# Patient Record
Sex: Male | Born: 1990 | Race: White | Hispanic: No | Marital: Married | Smoking: Never smoker
Health system: Southern US, Community
[De-identification: ages and names within clinical notes are randomized; demographics above are authoritative.]

## PROBLEM LIST (undated history)

## (undated) DIAGNOSIS — F329 Major depressive disorder, single episode, unspecified: Secondary | ICD-10-CM

## (undated) DIAGNOSIS — F32A Depression, unspecified: Secondary | ICD-10-CM

## (undated) DIAGNOSIS — K219 Gastro-esophageal reflux disease without esophagitis: Secondary | ICD-10-CM

## (undated) DIAGNOSIS — Z9109 Other allergy status, other than to drugs and biological substances: Secondary | ICD-10-CM

## (undated) DIAGNOSIS — F419 Anxiety disorder, unspecified: Secondary | ICD-10-CM

## (undated) DIAGNOSIS — I493 Ventricular premature depolarization: Secondary | ICD-10-CM

## (undated) DIAGNOSIS — L409 Psoriasis, unspecified: Secondary | ICD-10-CM

## (undated) HISTORY — DX: Other allergy status, other than to drugs and biological substances: Z91.09

## (undated) HISTORY — DX: Major depressive disorder, single episode, unspecified: F32.9

## (undated) HISTORY — DX: Gastro-esophageal reflux disease without esophagitis: K21.9

## (undated) HISTORY — DX: Depression, unspecified: F32.A

## (undated) HISTORY — PX: WISDOM TOOTH EXTRACTION: SHX21

## (undated) HISTORY — DX: Anxiety disorder, unspecified: F41.9

## (undated) HISTORY — DX: Psoriasis, unspecified: L40.9

---
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
## 2010-08-10 HISTORY — PX: SHOULDER ARTHROSCOPY WITH LABRAL REPAIR: SHX5691

## 2016-01-02 ENCOUNTER — Other Ambulatory Visit: Payer: Self-pay | Admitting: Internal Medicine

## 2016-01-02 ENCOUNTER — Telehealth: Payer: Self-pay

## 2016-01-02 MED ORDER — AMOXICILLIN-POT CLAVULANATE 875-125 MG PO TABS: 1.0000 | ORAL_TABLET | Freq: Two times a day (BID) | ORAL | Status: DC

## 2016-01-02 MED ORDER — LEVOFLOXACIN 500 MG PO TABS: 500.0000 mg | ORAL_TABLET | Freq: Every day | ORAL | Status: DC

## 2016-01-02 NOTE — Telephone Encounter (Signed)
Right side of neck feels like it has a ball in it, heavy lump that has a itchy feeling, took one dose of the medication.  He states he doesn't, believe he has any allergies, but has never taken the medication before.  Please advise, I told him to hold taking the med for now.

## 2016-01-02 NOTE — Addendum Note (Signed)
Addended by: Acey LavOMAN, TANYA M on: 01/02/2016 01:54 PM   Modules accepted: Orders

## 2016-01-02 NOTE — Telephone Encounter (Signed)
We can examine him if he wants to come in. This may be a lymph node. He denied any allergy to PCN, however if he is concerned about allergy, we can call in Levaquin 500mg  daily for 7 days.

## 2016-01-02 NOTE — Telephone Encounter (Signed)
Spoke with Mal AmabileBrock, he agreed to Levaquin sent to the pharmacy.  He will come by to get a coupon code, thanks

## 2016-01-08 ENCOUNTER — Encounter: Payer: Self-pay | Admitting: Family Medicine

## 2016-01-09 ENCOUNTER — Telehealth: Payer: Self-pay | Admitting: Family Medicine

## 2016-01-09 ENCOUNTER — Other Ambulatory Visit: Payer: Self-pay | Admitting: Family Medicine

## 2016-01-09 MED ORDER — ESCITALOPRAM OXALATE 20 MG PO TABS: 20.0000 mg | ORAL_TABLET | Freq: Every day | ORAL | Status: DC

## 2016-01-09 MED ORDER — PANTOPRAZOLE SODIUM 40 MG PO TBEC: 40.0000 mg | DELAYED_RELEASE_TABLET | Freq: Every day | ORAL | Status: DC

## 2016-01-09 NOTE — Telephone Encounter (Signed)
Patient called stating [facility] Pt was told to see if [facility] [place] [facility] [place] could be transferred to [facility]. If they will not transfer and verbal will be given to [facility].

## 2016-01-24 ENCOUNTER — Ambulatory Visit (INDEPENDENT_AMBULATORY_CARE_PROVIDER_SITE_OTHER): Payer: 59 | Admitting: Family Medicine

## 2016-01-24 VITALS — BP 124/92 | HR 75 | Temp 98.3°F | Ht 72.03 in | Wt 256.2 lb

## 2016-01-24 DIAGNOSIS — Z13220 Encounter for screening for lipoid disorders: Secondary | ICD-10-CM | POA: Diagnosis not present

## 2016-01-24 DIAGNOSIS — F419 Anxiety disorder, unspecified: Secondary | ICD-10-CM

## 2016-01-24 DIAGNOSIS — E669 Obesity, unspecified: Secondary | ICD-10-CM

## 2016-01-24 DIAGNOSIS — R002 Palpitations: Secondary | ICD-10-CM | POA: Diagnosis not present

## 2016-01-24 LAB — COMPREHENSIVE METABOLIC PANEL
ALK PHOS: 90 U/L (ref 40–115)
ALT: 12 U/L (ref 9–46)
AST: 21 U/L (ref 10–40)
Albumin: 4.5 g/dL (ref 3.6–5.1)
BILIRUBIN TOTAL: 1.2 mg/dL (ref 0.2–1.2)
BUN: 12 mg/dL (ref 7–25)
CALCIUM: 9.1 mg/dL (ref 8.6–10.3)
CO2: 26 mmol/L (ref 20–31)
Chloride: 103 mmol/L (ref 98–110)
Creat: 1.07 mg/dL (ref 0.60–1.35)
GLUCOSE: 95 mg/dL (ref 65–99)
Potassium: 4.4 mmol/L (ref 3.5–5.3)
SODIUM: 137 mmol/L (ref 135–146)
Total Protein: 7.1 g/dL (ref 6.1–8.1)

## 2016-01-24 LAB — CBC
HCT: 46.5 % (ref 38.5–50.0)
Hemoglobin: 15.5 g/dL (ref 13.2–17.1)
MCH: 29 pg (ref 27.0–33.0)
MCHC: 33.3 g/dL (ref 32.0–36.0)
MCV: 86.9 fL (ref 80.0–100.0)
MPV: 9.9 fL (ref 7.5–12.5)
PLATELETS: 202 10*3/uL (ref 140–400)
RBC: 5.35 MIL/uL (ref 4.20–5.80)
RDW: 13.1 % (ref 11.0–15.0)
WBC: 7.8 10*3/uL (ref 3.8–10.8)

## 2016-01-24 LAB — LIPID PANEL
CHOL/HDL RATIO: 4.1 ratio (ref <=5.0)
CHOLESTEROL: 177 mg/dL (ref 125–200)
HDL: 43 mg/dL (ref >=40)
LDL Cholesterol: 98 mg/dL (ref <130)
Triglycerides: 180 mg/dL — ABNORMAL HIGH (ref <150)
VLDL: 36 mg/dL — ABNORMAL HIGH (ref <30)

## 2016-01-24 LAB — TSH: TSH: 1.34 m[IU]/L (ref 0.40–4.50)

## 2016-01-24 MED ORDER — ALPRAZOLAM 0.5 MG PO TABS: 0.5000 mg | ORAL_TABLET | Freq: Three times a day (TID) | ORAL | Status: DC | PRN

## 2016-01-24 NOTE — Progress Notes (Signed)
Pre visit review using our clinic review tool, if applicable. No additional management support is needed unless otherwise documented below in the visit note. 

## 2016-01-25 LAB — HEMOGLOBIN A1C
Hgb A1c MFr Bld: 5.3 % (ref <5.7)
MEAN PLASMA GLUCOSE: 105 mg/dL

## 2016-01-27 ENCOUNTER — Encounter: Payer: Self-pay | Admitting: Family Medicine

## 2016-01-27 DIAGNOSIS — R002 Palpitations: Secondary | ICD-10-CM | POA: Insufficient documentation

## 2016-01-27 DIAGNOSIS — F419 Anxiety disorder, unspecified: Secondary | ICD-10-CM | POA: Insufficient documentation

## 2016-01-27 NOTE — Assessment & Plan Note (Signed)
New problem (to me). Uncontrolled. Adding PRN Xanax today.

## 2016-01-27 NOTE — Progress Notes (Signed)
Subjective:  Patient ID: RAD GRAMLING, male    DOB: [birth year]  Age: 25 y.o. MRN: [id number]  CC: Anxiety, Palpitations  HPI Mark Palmer is a 25 y.o. male presents to the clinic today with the above complaints.  Anxiety  Patient has a longstanding history of anxiety.  He reports recent worsening since leaving the [employer].  Anxiety is particularly troublesome at night.  He's had several episodeswhere he is incredibly anxious and experiences palpitations.  No known inciting factor. No exacerbating factors.  He is currently taking Lexapro.   He has been on Benzos in the past.  He reports improvement with Lexapro but still has significant symptoms.  Palpitations/heart fluttering  Particularly at night.  Likely associated with anxiety.  No reports of chest pain or shortness of breath.  He has had some associated dizziness.  No syncope.  Appears to be worsened by anxiety.  PMH, Surgical Hx, Family Hx, Social History reviewed and updated as below.  Past Medical History  Diagnosis Date  . Depression   . GERD (gastroesophageal reflux disease)   . Environmental allergies   . Anxiety   . Psoriasis    Past Surgical History  Procedure Laterality Date  . Shoulder arthroscopy with labral repair Right 2012  . Wisdom tooth extraction     Family History  Problem Relation Age of Onset  . Prostate cancer Maternal Grandfather   . Stroke Maternal Grandfather   . Hypertension Mother   . Hypertension Maternal Uncle   . Mental illness Mother     anxiety  . Mental illness Maternal Aunt     anxiety  . Mental illness Maternal Aunt     anxiety  . Mental illness Maternal Uncle     anxiety  . Diabetes Maternal Grandfather   . Diabetes Maternal Uncle    Social History  Substance Use Topics  . Smoking status: Never Smoker   . Smokeless tobacco: Current User    Types: Snuff  . Alcohol Use: 0.0 - 0.6 oz/week    0-1 Standard drinks or equivalent per week   Review of  Systems  Cardiovascular: Positive for palpitations.  Psychiatric/Behavioral: The patient is nervous/anxious.   All other systems reviewed and are negative.  Objective:   Today's Vitals: BP 124/92 mmHg  Pulse 75  Temp(Src) 98.3 F (36.8 C) (Oral)  Ht 6' 0.02" (1.829 m)  Wt 256 lb 4 oz (116.234 kg)  BMI 34.75 kg/m2  SpO2 98%  Physical Exam  Constitutional: He is oriented to person, place, and time. He appears well-developed and well-nourished. No distress.  HENT:  Head: Normocephalic and atraumatic.  Nose: Nose normal.  Mouth/Throat: Oropharynx is clear and moist. No oropharyngeal exudate.  Normal TM's bilaterally.   Eyes: Conjunctivae are normal. No scleral icterus.  Neck: Neck supple. No thyromegaly present.  Cardiovascular: Normal rate and regular rhythm.   No murmur heard. Pulmonary/Chest: Effort normal and breath sounds normal. He has no wheezes. He has no rales.  Abdominal: Soft. He exhibits no distension. There is no tenderness. There is no rebound and no guarding.  Musculoskeletal: Normal range of motion. He exhibits no edema.  Lymphadenopathy:    He has no cervical adenopathy.  Neurological: He is alert and oriented to person, place, and time.  Skin: Skin is warm and dry. No rash noted.  Psychiatric:  Anxious.  Vitals reviewed.  Assessment & Plan:   Problem List Items Addressed This Visit    Anxiety - Primary    New  problem (to me). Uncontrolled. Adding PRN Xanax today.      Relevant Medications   ALPRAZolam (XANAX) 0.5 MG tablet   Palpitations    Patient concerned about cardiac etiology. Long discussion about low likelihood for cardiac etiology.  Likely secondary to anxiety. Will continue to monitor.      Relevant Orders   CBC (Completed)   Comp Met (CMET) (Completed)   TSH (Completed)    Other Visit Diagnoses    Screening, lipid        Relevant Orders    Lipid Profile (Completed)    Obesity (BMI 30.0-34.9)        Relevant Orders    HgB A1c  (Completed)       Outpatient Encounter Prescriptions as of 01/24/2016  Medication Sig  . escitalopram (LEXAPRO) 20 MG tablet Take 1 tablet (20 mg total) by mouth daily.  . pantoprazole (PROTONIX) 40 MG tablet Take 1 tablet (40 mg total) by mouth daily.  Marland Kitchen ALPRAZolam (XANAX) 0.5 MG tablet Take 1 tablet (0.5 mg total) by mouth 3 (three) times daily as needed for anxiety.   No facility-administered encounter medications on file as of 01/24/2016.   Follow-up: PRN  [place]

## 2016-01-27 NOTE — Assessment & Plan Note (Signed)
Patient concerned about cardiac etiology. Long discussion about low likelihood for cardiac etiology.  Likely secondary to anxiety. Will continue to monitor.

## 2016-03-13 ENCOUNTER — Other Ambulatory Visit: Payer: Self-pay | Admitting: Internal Medicine

## 2016-03-13 ENCOUNTER — Telehealth: Payer: Self-pay | Admitting: Family Medicine

## 2016-03-13 ENCOUNTER — Other Ambulatory Visit: Payer: Self-pay | Admitting: Family Medicine

## 2016-03-13 ENCOUNTER — Ambulatory Visit: Payer: 59 | Admitting: Internal Medicine

## 2016-03-13 ENCOUNTER — Ambulatory Visit (INDEPENDENT_AMBULATORY_CARE_PROVIDER_SITE_OTHER): Payer: 59 | Admitting: Internal Medicine

## 2016-03-13 ENCOUNTER — Other Ambulatory Visit (INDEPENDENT_AMBULATORY_CARE_PROVIDER_SITE_OTHER): Payer: 59

## 2016-03-13 DIAGNOSIS — R079 Chest pain, unspecified: Secondary | ICD-10-CM

## 2016-03-13 DIAGNOSIS — R002 Palpitations: Secondary | ICD-10-CM

## 2016-03-13 LAB — COMPREHENSIVE METABOLIC PANEL
ALBUMIN: 4.6 g/dL (ref 3.5–5.2)
ALK PHOS: 88 U/L (ref 39–117)
ALT: 12 U/L (ref 0–53)
AST: 20 U/L (ref 0–37)
BILIRUBIN TOTAL: 1.1 mg/dL (ref 0.2–1.2)
BUN: 15 mg/dL (ref 6–23)
CALCIUM: 9.6 mg/dL (ref 8.4–10.5)
CO2: 28 mEq/L (ref 19–32)
Chloride: 102 mEq/L (ref 96–112)
Creatinine, Ser: 1.12 mg/dL (ref 0.40–1.50)
GFR: 84.37 mL/min (ref >60.00)
GLUCOSE: 110 mg/dL — AB (ref 70–99)
Potassium: 4.5 mEq/L (ref 3.5–5.1)
Sodium: 138 mEq/L (ref 135–145)
TOTAL PROTEIN: 7.2 g/dL (ref 6.0–8.3)

## 2016-03-13 LAB — TROPONIN I: TNIDX: 0 ug/l (ref 0.00–0.06)

## 2016-03-13 LAB — CBC
HCT: 43.6 % (ref 39.0–52.0)
Hemoglobin: 15.3 g/dL (ref 13.0–17.0)
MCHC: 35.2 g/dL (ref 30.0–36.0)
MCV: 83.6 fl (ref 78.0–100.0)
PLATELETS: 233 10*3/uL (ref 150.0–400.0)
RBC: 5.22 Mil/uL (ref 4.22–5.81)
RDW: 12.3 % (ref 11.5–15.5)
WBC: 5.7 10*3/uL (ref 4.0–10.5)

## 2016-03-13 NOTE — Progress Notes (Signed)
Patient is stating he is having palpitations. He asked for a EKG. PCP agreed to have order placed.

## 2016-03-13 NOTE — Telephone Encounter (Signed)
Patient stated that he is having heart flutters. He is requesting a EKG to make sure that his heart is okay. Are you okay with this order being placed?

## 2016-03-13 NOTE — Telephone Encounter (Signed)
EKG okay to be ordered.

## 2016-03-16 ENCOUNTER — Other Ambulatory Visit: Payer: Self-pay | Admitting: Family Medicine

## 2016-03-16 DIAGNOSIS — R079 Chest pain, unspecified: Secondary | ICD-10-CM

## 2016-03-16 DIAGNOSIS — R002 Palpitations: Secondary | ICD-10-CM

## 2016-03-24 ENCOUNTER — Other Ambulatory Visit: Payer: Self-pay

## 2016-03-24 ENCOUNTER — Telehealth: Payer: Self-pay | Admitting: Family Medicine

## 2016-03-24 ENCOUNTER — Ambulatory Visit (INDEPENDENT_AMBULATORY_CARE_PROVIDER_SITE_OTHER): Payer: 59

## 2016-03-24 DIAGNOSIS — R079 Chest pain, unspecified: Secondary | ICD-10-CM

## 2016-03-24 DIAGNOSIS — R002 Palpitations: Secondary | ICD-10-CM

## 2016-03-24 NOTE — Telephone Encounter (Signed)
Patient wanted to know the results of echocardiogram. Please look over and let patient know results.

## 2016-03-24 NOTE — Telephone Encounter (Signed)
In progress. Not available to read.

## 2016-03-25 ENCOUNTER — Encounter: Payer: Self-pay | Admitting: Family Medicine

## 2016-03-27 ENCOUNTER — Ambulatory Visit
Admission: RE | Admit: 2016-03-27 | Discharge: 2016-03-27 | Disposition: A | Discharge status: Home / Self Care | Payer: 59 | Source: Ambulatory Visit | Attending: Cardiovascular Disease | Admitting: Cardiovascular Disease

## 2016-03-27 DIAGNOSIS — R002 Palpitations: Secondary | ICD-10-CM | POA: Insufficient documentation

## 2016-04-05 ENCOUNTER — Other Ambulatory Visit: Payer: Self-pay | Admitting: Family Medicine

## 2016-04-06 MED ORDER — PANTOPRAZOLE SODIUM 40 MG PO TBEC: 40.0000 mg | DELAYED_RELEASE_TABLET | Freq: Every day | ORAL | 3 refills | Status: DC

## 2016-04-06 MED ORDER — ESCITALOPRAM OXALATE 20 MG PO TABS: 20.0000 mg | ORAL_TABLET | Freq: Every day | ORAL | 3 refills | Status: DC

## 2016-06-09 ENCOUNTER — Encounter: Payer: Self-pay | Admitting: Family

## 2016-06-09 ENCOUNTER — Ambulatory Visit (INDEPENDENT_AMBULATORY_CARE_PROVIDER_SITE_OTHER): Payer: 59 | Admitting: Family

## 2016-06-09 VITALS — BP 128/88 | HR 89 | Temp 98.1°F | Ht 72.0 in | Wt 270.0 lb

## 2016-06-09 DIAGNOSIS — F419 Anxiety disorder, unspecified: Secondary | ICD-10-CM

## 2016-06-09 DIAGNOSIS — R002 Palpitations: Secondary | ICD-10-CM | POA: Diagnosis not present

## 2016-06-09 NOTE — Progress Notes (Signed)
Subjective:    Patient ID: Mark Palmer, male    DOB: 04-May-1991, 25 y.o.   MRN: [id number]  CC: Mark Palmer is a 25 y.o. male who presents today for an acute visit.    HPI: Patient here for acute visit with chief complaint of heart palpitations a couple of hours ago. Felt 'flutter an blood rush to head'. Feeling anxious and SOB 'in general'. No SOB with movement.  Has a history of anxiety.  he notes shortness of breath, chest tightness. Notes a HA yesterday on left side of eye, resolved with motrin.  Hasn't taken xanax today.   Denies exertional chest pain or pressure, numbness or tingling radiating to left arm or jaw,  dizziness,changes in vision.   Holter monitor and echo recently done and normal. Holter showed PVCs and PACs.     HISTORY:  Past Medical History:  Diagnosis Date  . Anxiety   . Depression   . Environmental allergies   . GERD (gastroesophageal reflux disease)   . Psoriasis    Past Surgical History:  Procedure Laterality Date  . SHOULDER ARTHROSCOPY WITH LABRAL REPAIR Right 2012  . WISDOM TOOTH EXTRACTION     Family History  Problem Relation Age of Onset  . Prostate cancer Maternal Grandfather   . Stroke Maternal Grandfather   . Diabetes Maternal Grandfather   . Hypertension Mother   . Mental illness Mother     anxiety  . Hypertension Maternal Uncle   . Mental illness Maternal Aunt     anxiety  . Mental illness Maternal Aunt     anxiety  . Mental illness Maternal Uncle     anxiety  . Diabetes Maternal Uncle     Allergies: Amoxicillin Current Outpatient Prescriptions on File Prior to Visit  Medication Sig Dispense Refill  . ALPRAZolam (XANAX) 0.5 MG tablet Take 1 tablet (0.5 mg total) by mouth 3 (three) times daily as needed for anxiety. 30 tablet 0  . escitalopram (LEXAPRO) 20 MG tablet Take 1 tablet (20 mg total) by mouth daily. 90 tablet 3  . pantoprazole (PROTONIX) 40 MG tablet Take 1 tablet (40 mg total) by mouth daily. 90 tablet 3   No  current facility-administered medications on file prior to visit.     Social History  Substance Use Topics  . Smoking status: Never Smoker  . Smokeless tobacco: Current User    Types: Snuff  . Alcohol use 0.0 - 0.6 oz/week    Review of Systems  Constitutional: Negative for chills and fever.  Eyes: Negative for visual disturbance.  Respiratory: Positive for chest tightness and shortness of breath. Negative for cough.   Cardiovascular: Positive for palpitations. Negative for chest pain.  Gastrointestinal: Negative for nausea and vomiting.  Neurological: Negative for dizziness, light-headedness and headaches (resolved).  Psychiatric/Behavioral: The patient is nervous/anxious.       Objective:    BP 128/88   Pulse 89   Temp 98.1 F (36.7 C) (Oral)   Ht 6' (1.829 m)   Wt 270 lb (122.5 kg)   SpO2 97%   BMI 36.62 kg/m    Physical Exam  Constitutional: He appears well-developed and well-nourished.  Cardiovascular: Regular rhythm and normal heart sounds.   Pulmonary/Chest: Effort normal and breath sounds normal. No respiratory distress. He has no wheezes. He has no rhonchi. He has no rales.  Neurological: He is alert.  Skin: Skin is warm and dry.  Psychiatric: He has a normal mood and affect. His speech is  normal and behavior is normal.  Vitals reviewed.      Assessment & Plan:   Problem List Items Addressed This Visit      Other   Anxiety    Suspect anxiety causing palpitations. Advised patient to take one xanax as prescribed to see if improves. Agreeable to counseling as well. Referral placed.       Relevant Orders   Ambulatory referral to Psychiatry   Palpitations - Primary    Reassured by normal EKG and recent cardiac work up. Normal sinus rhythm. 86 bpm. When compared to prior EKG 03/2016, no significant changes. No ischemia. SaO2 97.      Relevant Orders   EKG 12-Lead (Completed)    Other Visit Diagnoses   None.       I am having Mr. Mark Palmer maintain  his ALPRAZolam, escitalopram, and pantoprazole.   No orders of the defined types were placed in this encounter.   Return precautions given.   Risks, benefits, and alternatives of the medications and treatment plan prescribed today were discussed, and patient expressed understanding.   Education regarding symptom management and diagnosis given to patient on AVS.  Continue to follow with Tommie SamsJayce G Cook, DO for routine health maintenance.   Mark Palmer and I agreed with plan.   Rennie PlowmanMargaret Kaymen Adrian, FNP

## 2016-06-09 NOTE — Assessment & Plan Note (Addendum)
Reassured by normal EKG and recent cardiac work up. Normal sinus rhythm. 86 bpm. When compared to prior EKG 03/2016, no significant changes. No ischemia. SaO2 97.

## 2016-06-09 NOTE — Progress Notes (Signed)
Pre visit review using our clinic review tool, if applicable. No additional management support is needed unless otherwise documented below in the visit note. 

## 2016-06-09 NOTE — Patient Instructions (Signed)
Suspect anxiety is contributing.   Please take xanax and see if improves.  Please let me know if not better.

## 2016-06-09 NOTE — Assessment & Plan Note (Signed)
Suspect anxiety causing palpitations. Advised patient to take one xanax as prescribed to see if improves. Agreeable to counseling as well. Referral placed.

## 2016-07-06 ENCOUNTER — Other Ambulatory Visit: Payer: Self-pay | Admitting: Family Medicine

## 2016-07-06 MED ORDER — PANTOPRAZOLE SODIUM 40 MG PO TBEC: 40.0000 mg | DELAYED_RELEASE_TABLET | Freq: Every day | ORAL | 3 refills | Status: DC

## 2016-07-06 MED ORDER — ESCITALOPRAM OXALATE 20 MG PO TABS: 20.0000 mg | ORAL_TABLET | Freq: Every day | ORAL | 3 refills | Status: DC

## 2016-07-06 NOTE — Telephone Encounter (Signed)
Pt called requesting a refill on protonix and lexapro.

## 2016-07-15 ENCOUNTER — Other Ambulatory Visit: Payer: Self-pay | Admitting: Family Medicine

## 2016-07-15 MED ORDER — FLUOCINONIDE 0.05 % EX CREA: 1.0000 "application " | TOPICAL_CREAM | Freq: Two times a day (BID) | CUTANEOUS | 0 refills | Status: DC

## 2016-10-29 NOTE — Progress Notes (Signed)
Lab encounter

## 2016-11-10 ENCOUNTER — Emergency Department: Payer: 59

## 2016-11-10 ENCOUNTER — Encounter: Payer: Self-pay | Admitting: Emergency Medicine

## 2016-11-10 DIAGNOSIS — R0602 Shortness of breath: Secondary | ICD-10-CM | POA: Diagnosis not present

## 2016-11-10 DIAGNOSIS — R079 Chest pain, unspecified: Secondary | ICD-10-CM | POA: Diagnosis not present

## 2016-11-10 DIAGNOSIS — F17290 Nicotine dependence, other tobacco product, uncomplicated: Secondary | ICD-10-CM | POA: Diagnosis not present

## 2016-11-10 DIAGNOSIS — Z79899 Other long term (current) drug therapy: Secondary | ICD-10-CM | POA: Insufficient documentation

## 2016-11-10 DIAGNOSIS — R002 Palpitations: Secondary | ICD-10-CM | POA: Insufficient documentation

## 2016-11-10 LAB — BASIC METABOLIC PANEL
ANION GAP: 7 (ref 5–15)
BUN: 14 mg/dL (ref 6–20)
CALCIUM: 8.9 mg/dL (ref 8.9–10.3)
CO2: 27 mmol/L (ref 22–32)
Chloride: 104 mmol/L (ref 101–111)
Creatinine, Ser: 1.06 mg/dL (ref 0.61–1.24)
Glucose, Bld: 97 mg/dL (ref 65–99)
Potassium: 3.5 mmol/L (ref 3.5–5.1)
Sodium: 138 mmol/L (ref 135–145)

## 2016-11-10 LAB — CBC
HCT: 40.3 % (ref 40.0–52.0)
HEMOGLOBIN: 14.2 g/dL (ref 13.0–18.0)
MCH: 29.8 pg (ref 26.0–34.0)
MCHC: 35.3 g/dL (ref 32.0–36.0)
MCV: 84.6 fL (ref 80.0–100.0)
Platelets: 215 10*3/uL (ref 150–440)
RBC: 4.77 MIL/uL (ref 4.40–5.90)
RDW: 12.2 % (ref 11.5–14.5)
WBC: 7 10*3/uL (ref 3.8–10.6)

## 2016-11-10 LAB — TROPONIN I

## 2016-11-10 NOTE — ED Notes (Signed)
NP from [facility] called, pt called co palpitations she was concerned about electrolyte imbalance due to diarrhea, or anxiety.

## 2016-11-10 NOTE — ED Triage Notes (Signed)
Patient ambulatory to triage with steady gait, without difficulty or distress noted; pt reports feeling heart "flutters"; st hx of same and dx with PVC and PACs; pt reports discomfort to mid chest with no accomp symptoms

## 2016-11-11 ENCOUNTER — Emergency Department
Admission: EM | Admit: 2016-11-11 | Discharge: 2016-11-11 | Disposition: A | Discharge status: Home / Self Care | Payer: 59 | Attending: Emergency Medicine | Admitting: Emergency Medicine

## 2016-11-11 DIAGNOSIS — R002 Palpitations: Secondary | ICD-10-CM

## 2016-11-11 NOTE — ED Provider Notes (Signed)
[facility] Emergency Department Provider Note  ____________________________________________   First MD Initiated Contact with Patient 11/11/16 0200     (approximate)  I have reviewed the triage vital signs and the nursing notes.   HISTORY  Chief Complaint Palpitations    HPI Mark Palmer is a 26 y.o. male with a history of anxiety and palpitations who presents for evaluation of palpitations that started acutely earlier tonight and were severe.  Nothing particular made them worse and they got better after he took a Xanax.  He describes it as a feeling of fluttering in his chest with some associated chest discomfort and occasional shortness of breath.  It feels similar to what he has experienced in the past.  He goes to Dr. Adriana Simas who has worked him up previously with a Holter monitor and echocardiogram.  He was told that his symptoms may be the result of anxiety and he does note that after taking a Xanax tonight he did feel better.  He has not been ill recently with any cold like symptoms although he has had diarrhea for several days but that is improving.  He denies fever/chills, nausea, vomiting, constipation.   Past Medical History:  Diagnosis Date  . Anxiety   . Depression   . Environmental allergies   . GERD (gastroesophageal reflux disease)   . Psoriasis     Patient Active Problem List   Diagnosis Date Noted  . Anxiety 01/27/2016  . Palpitations 01/27/2016    Past Surgical History:  Procedure Laterality Date  . SHOULDER ARTHROSCOPY WITH LABRAL REPAIR Right 2012  . WISDOM TOOTH EXTRACTION      Prior to Admission medications   Medication Sig Start Date End Date Taking? Authorizing Provider  ALPRAZolam Prudy Feeler) 0.5 MG tablet Take 1 tablet (0.5 mg total) by mouth 3 (three) times daily as needed for anxiety. 01/24/16   Jayce G Cook, DO  escitalopram (LEXAPRO) 20 MG tablet Take 1 tablet (20 mg total) by mouth daily. 07/06/16   Tommie Sams, DO    fluocinonide cream (LIDEX) 0.05 % Apply 1 application topically 2 (two) times daily. 07/15/16   Tommie Sams, DO  pantoprazole (PROTONIX) 40 MG tablet Take 1 tablet (40 mg total) by mouth daily. 07/06/16   Tommie Sams, DO    Allergies Amoxicillin  Family History  Problem Relation Age of Onset  . Prostate cancer Maternal Grandfather   . Stroke Maternal Grandfather   . Diabetes Maternal Grandfather   . Hypertension Mother   . Mental illness Mother     anxiety  . Hypertension Maternal Uncle   . Mental illness Maternal Aunt     anxiety  . Mental illness Maternal Aunt     anxiety  . Mental illness Maternal Uncle     anxiety  . Diabetes Maternal Uncle     Social History Social History  Substance Use Topics  . Smoking status: Never Smoker  . Smokeless tobacco: Current User    Types: Snuff  . Alcohol use 0.0 - 0.6 oz/week    Review of Systems Constitutional: No fever/chills Eyes: No visual changes. ENT: No sore throat. Cardiovascular: Palpitations and associated chest pain Respiratory: Mild Shortness of breath associated with the palpitations Gastrointestinal: No abdominal pain.  No nausea, no vomiting.  No diarrhea.  No constipation. Genitourinary: Negative for dysuria. Musculoskeletal: Negative for back pain. Skin: Negative for rash. Neurological: Negative for headaches, focal weakness or numbness.  10-point ROS otherwise negative.  ____________________________________________  PHYSICAL EXAM:  VITAL SIGNS: ED Triage Vitals [11/10/16 2140]  Enc Vitals Group     BP (!) 150/95     Pulse Rate 87     Resp 18     Temp 99.1 F (37.3 C)     Temp Source Oral     SpO2 97 %     Weight 282 lb (127.9 kg)     Height 6' (1.829 m)     Head Circumference      Peak Flow      Pain Score      Pain Loc      Pain Edu?      Excl. in GC?     Constitutional: Alert and oriented. Well appearing and in no acute distress. Eyes: Conjunctivae are normal. PERRL. EOMI. Head:  Atraumatic. Nose: No congestion/rhinnorhea. Mouth/Throat: Mucous membranes are moist. Neck: No stridor.  No meningeal signs.   Cardiovascular: Normal rate, regular rhythm. Good peripheral circulation. Grossly normal heart sounds. Respiratory: Normal respiratory effort.  No retractions. Lungs CTAB. Gastrointestinal: Soft and nontender. No distention.  Musculoskeletal: No lower extremity tenderness nor edema. No gross deformities of extremities. Neurologic:  Normal speech and language. No gross focal neurologic deficits are appreciated.  Skin:  Skin is warm, dry and intact. No rash noted. Psychiatric: Mood and affect are normal. Speech and behavior are normal.  ____________________________________________   LABS (all labs ordered are listed, but only abnormal results are displayed)  Labs Reviewed  BASIC METABOLIC PANEL  CBC  TROPONIN I   ____________________________________________  EKG  ED ECG REPORT I, Emslee Lopezmartinez, the attending physician, personally viewed and interpreted this ECG.  Date: 11/10/2016 EKG Time: 21:38 Rate: 85 Rhythm: normal sinus rhythm QRS Axis: normal Intervals: normal ST/T Wave abnormalities: normal Conduction Disturbances: none Narrative Interpretation: unremarkable  ____________________________________________  RADIOLOGY   Dg Chest 2 View  Result Date: 11/10/2016 CLINICAL DATA:  Initial evaluation for acute chest pain. EXAM: CHEST  2 VIEW COMPARISON:  None. FINDINGS: The cardiac and mediastinal silhouettes are stable in size and contour, and remain within normal limits. The lungs are normally inflated. Mild patchy and linear densities within the lingula and right middle lobe, atelectasis is favored, although small infiltrate not excluded. No other focal airspace disease. No pulmonary edema or pleural effusion. No pneumothorax. No acute osseous abnormality identified. IMPRESSION: 1. Patchy and linear densities within the lingula and right middle  lobe. Atelectasis is favored, although superimposed infiltrate not entirely excluded, and could be considered in the correct clinical setting. 2. No other active cardiopulmonary disease. Electronically Signed   By: Rise Mu M.D.   On: 11/10/2016 22:26    ____________________________________________   PROCEDURES  Critical Care performed: No   Procedure(s) performed:   Procedures   ____________________________________________   INITIAL IMPRESSION / ASSESSMENT AND PLAN / ED COURSE  Pertinent labs & imaging results that were available during my care of the patient were reviewed by me and considered in my medical decision making (see chart for details).  The patient has been worked up extensively in the past with a Holter monitor and echocardiogram and the results were reassuring.  His symptoms improved today after taking his Xanax.  He is currently asymptomatic with a reassuring workup.  His chest x-ray brought up the possibility of atelectasis versus infiltrate but he has no respiratory difficulties, no fever, no leukocytosis, and otherwise normal vital signs.  I think pneumonia would be very unlikely and there is no indication for treatment based on a  questionable radiographic finding.  I had my usual customary palpitations discussion with the patient and he will follow-up with his primary care doctor who is managed his workup in the past.  There is no indication for repeat troponin.  I gave my usual and customary return precautions.         ____________________________________________  FINAL CLINICAL IMPRESSION(S) / ED DIAGNOSES  Final diagnoses:  Palpitations     MEDICATIONS GIVEN DURING THIS VISIT:  Medications - No data to display   NEW OUTPATIENT MEDICATIONS STARTED DURING THIS VISIT:  New Prescriptions   No medications on file    Modified Medications   No medications on file    Discontinued Medications   No medications on file     Note:   This document was prepared using Dragon voice recognition software and may include unintentional dictation errors.    Loleta Rose, MD 11/11/16 [phone]

## 2016-11-11 NOTE — Discharge Instructions (Signed)
As we discussed, your workup today was reassuring.  Though we do not know exactly what is causing your symptoms, it appears that you have no emergent medical condition at this time and that you are safe to go home and follow up as recommended in this paperwork. ° °Please return immediately to the Emergency Department if you develop any new or worsening symptoms that concern you. ° °

## 2016-11-11 NOTE — ED Notes (Signed)
Pt discharged to home.  Family member driving.  Discharge instructions reviewed.  Verbalized understanding.  No questions or concerns at this time.  Teach back verified.  Pt in NAD.  No items left in ED.   

## 2016-11-17 ENCOUNTER — Other Ambulatory Visit: Payer: Self-pay | Admitting: Family Medicine

## 2016-11-17 DIAGNOSIS — R002 Palpitations: Secondary | ICD-10-CM

## 2016-12-09 ENCOUNTER — Ambulatory Visit (INDEPENDENT_AMBULATORY_CARE_PROVIDER_SITE_OTHER): Payer: 59 | Admitting: Internal Medicine

## 2016-12-09 ENCOUNTER — Encounter: Payer: Self-pay | Admitting: Internal Medicine

## 2016-12-09 VITALS — BP 158/108 | HR 77 | Ht 72.0 in | Wt 289.0 lb

## 2016-12-09 DIAGNOSIS — R030 Elevated blood-pressure reading, without diagnosis of hypertension: Secondary | ICD-10-CM | POA: Diagnosis not present

## 2016-12-09 DIAGNOSIS — R002 Palpitations: Secondary | ICD-10-CM | POA: Diagnosis not present

## 2016-12-09 NOTE — Patient Instructions (Signed)
Medication Instructions:  Your physician recommends that you continue on your current medications as directed. Please refer to the Current Medication list given to you today.   Labwork: none  Testing/Procedures: none  Follow-Up: Your physician wants you to follow-up in: 3 MONTHS WITH DR END. You will receive a reminder letter in the mail two months in advance. If you don't receive a letter, please call our office to schedule the follow-up appointment.     DASH Eating Plan DASH stands for "Dietary Approaches to Stop Hypertension." The DASH eating plan is a healthy eating plan that has been shown to reduce high blood pressure (hypertension). It may also reduce your risk for type 2 diabetes, heart disease, and stroke. The DASH eating plan may also help with weight loss. What are tips for following this plan? General guidelines   Avoid eating more than 2,300 mg (milligrams) of salt (sodium) a day. If you have hypertension, you may need to reduce your sodium intake to 1,500 mg a day.  Limit alcohol intake to no more than 1 drink a day for nonpregnant women and 2 drinks a day for men. One drink equals 12 oz of beer, 5 oz of wine, or 1 oz of hard liquor.  Work with your health care provider to maintain a healthy body weight or to lose weight. Ask what an ideal weight is for you.  Get at least 30 minutes of exercise that causes your heart to beat faster (aerobic exercise) most days of the week. Activities may include walking, swimming, or biking.  Work with your health care provider or diet and nutrition specialist (dietitian) to adjust your eating plan to your individual calorie needs. Reading food labels   Check food labels for the amount of sodium per serving. Choose foods with less than 5 percent of the Daily Value of sodium. Generally, foods with less than 300 mg of sodium per serving fit into this eating plan.  To find whole grains, look for the word "whole" as the first word in the  ingredient list. Shopping   Buy products labeled as "low-sodium" or "no salt added."  Buy fresh foods. Avoid canned foods and premade or frozen meals. Cooking   Avoid adding salt when cooking. Use salt-free seasonings or herbs instead of table salt or sea salt. Check with your health care provider or pharmacist before using salt substitutes.  Do not fry foods. Cook foods using healthy methods such as baking, boiling, grilling, and broiling instead.  Cook with heart-healthy oils, such as olive, canola, soybean, or sunflower oil. Meal planning    Eat a balanced diet that includes:  5 or more servings of fruits and vegetables each day. At each meal, try to fill half of your plate with fruits and vegetables.  Up to 6-8 servings of whole grains each day.  Less than 6 oz of lean meat, poultry, or fish each day. A 3-oz serving of meat is about the same size as a deck of cards. One egg equals 1 oz.  2 servings of low-fat dairy each day.  A serving of nuts, seeds, or beans 5 times each week.  Heart-healthy fats. Healthy fats called Omega-3 fatty acids are found in foods such as flaxseeds and coldwater fish, like sardines, salmon, and mackerel.  Limit how much you eat of the following:  Canned or prepackaged foods.  Food that is high in trans fat, such as fried foods.  Food that is high in saturated fat, such as fatty meat.  Sweets, desserts, sugary drinks, and other foods with added sugar.  Full-fat dairy products.  Do not salt foods before eating.  Try to eat at least 2 vegetarian meals each week.  Eat more home-cooked food and less restaurant, buffet, and fast food.  When eating at a restaurant, ask that your food be prepared with less salt or no salt, if possible. What foods are recommended? The items listed may not be a complete list. Talk with your dietitian about what dietary choices are best for you. Grains  Whole-grain or whole-wheat bread. Whole-grain or  whole-wheat pasta. Brown rice. Orpah Cobb. Bulgur. Whole-grain and low-sodium cereals. Pita bread. Low-fat, low-sodium crackers. Whole-wheat flour tortillas. Vegetables  Fresh or frozen vegetables (raw, steamed, roasted, or grilled). Low-sodium or reduced-sodium tomato and vegetable juice. Low-sodium or reduced-sodium tomato sauce and tomato paste. Low-sodium or reduced-sodium canned vegetables. Fruits  All fresh, dried, or frozen fruit. Canned fruit in natural juice (without added sugar). Meat and other protein foods  Skinless chicken or [place]. Ground chicken or [place]. Pork with fat trimmed off. Fish and seafood. Egg whites. Dried beans, peas, or lentils. Unsalted nuts, nut butters, and seeds. Unsalted canned beans. Lean cuts of beef with fat trimmed off. Low-sodium, lean deli meat. Dairy  Low-fat (1%) or fat-free (skim) milk. Fat-free, low-fat, or reduced-fat cheeses. Nonfat, low-sodium ricotta or cottage cheese. Low-fat or nonfat yogurt. Low-fat, low-sodium cheese. Fats and oils  Soft margarine without trans fats. Vegetable oil. Low-fat, reduced-fat, or light mayonnaise and salad dressings (reduced-sodium). Canola, safflower, olive, soybean, and sunflower oils. Avocado. Seasoning and other foods  Herbs. Spices. Seasoning mixes without salt. Unsalted popcorn and pretzels. Fat-free sweets. What foods are not recommended? The items listed may not be a complete list. Talk with your dietitian about what dietary choices are best for you. Grains  Baked goods made with fat, such as croissants, muffins, or some breads. Dry pasta or rice meal packs. Vegetables  Creamed or fried vegetables. Vegetables in a cheese sauce. Regular canned vegetables (not low-sodium or reduced-sodium). Regular canned tomato sauce and paste (not low-sodium or reduced-sodium). Regular tomato and vegetable juice (not low-sodium or reduced-sodium). Rosita Fire. Olives. Fruits  Canned fruit in a light or heavy syrup. Fried  fruit. Fruit in cream or butter sauce. Meat and other protein foods  Fatty cuts of meat. Ribs. Fried meat. Tomasa Blase. Sausage. Bologna and other processed lunch meats. Salami. Fatback. Hotdogs. Bratwurst. Salted nuts and seeds. Canned beans with added salt. Canned or smoked fish. Whole eggs or egg yolks. Chicken or [place] with skin. Dairy  Whole or 2% milk, cream, and half-and-half. Whole or full-fat cream cheese. Whole-fat or sweetened yogurt. Full-fat cheese. Nondairy creamers. Whipped toppings. Processed cheese and cheese spreads. Fats and oils  Butter. Stick margarine. Lard. Shortening. Ghee. Bacon fat. Tropical oils, such as coconut, palm kernel, or palm oil. Seasoning and other foods  Salted popcorn and pretzels. Onion salt, garlic salt, seasoned salt, table salt, and sea salt. Worcestershire sauce. Tartar sauce. Barbecue sauce. Teriyaki sauce. Soy sauce, including reduced-sodium. Steak sauce. Canned and packaged gravies. Fish sauce. Oyster sauce. Cocktail sauce. Horseradish that you find on the shelf. Ketchup. Mustard. Meat flavorings and tenderizers. Bouillon cubes. Hot sauce and Tabasco sauce. Premade or packaged marinades. Premade or packaged taco seasonings. Relishes. Regular salad dressings. Where to find more information:  National Heart, Lung, and Blood Institute: PopSteam.is  American Heart Association: www.heart.org Summary  The DASH eating plan is a healthy eating plan that has been shown to reduce  high blood pressure (hypertension). It may also reduce your risk for type 2 diabetes, heart disease, and stroke.  With the DASH eating plan, you should limit salt (sodium) intake to 2,300 mg a day. If you have hypertension, you may need to reduce your sodium intake to 1,500 mg a day.  When on the DASH eating plan, aim to eat more fresh fruits and vegetables, whole grains, lean proteins, low-fat dairy, and heart-healthy fats.  Work with your health care provider or diet and  nutrition specialist (dietitian) to adjust your eating plan to your individual calorie needs. This information is not intended to replace advice given to you by your health care provider. Make sure you discuss any questions you have with your health care provider. Document Released: 07/16/2011 Document Revised: 07/20/2016 Document Reviewed: 07/20/2016 Elsevier Interactive Patient Education  2017 [employer].

## 2016-12-09 NOTE — Progress Notes (Signed)
New Outpatient Visit Date: 12/09/2016  Referring Provider: Tommie Sams, DO [address] [place], [place] [address]  Chief Complaint: Palpitations  HPI:  Mark Palmer is a 26 y.o. male who is being seen today for the evaluation of palpitations at the request of Dr. Adriana Simas. He has a history of anxiety, GERD, psoriasis, allergies. He reports intermittent palpitations that began last summer and were initially evaluated with a Holter monitor and echocardiogram. These had largely abated but returned in late March and early April. He described feeling skipped beats or brief flutters his chest that could occur as often as every few seconds. The episode in early April was preceded by nausea, vomiting, and diarrhea a few days earlier. His palpitations are associated with lightheadedness and the sensation of needing to catch his breath.  When he is active, he does not have any symptoms; palpitations are predominantly noticeable at rest. He presented to the ED on 11/11/16 for further evaluation due to increased frequency and duration of his palpitations. While sitting in the waiting room, he took a dose of Xanax and noticed significant improvement palpitations. Over the last 3 weeks, he has had only a few brief episodes. He consumes 3 cups of sweet tea per day but otherwise no caffeine.  Previous cardiac workup has included the aforementioned Holter monitor and echo. He also underwent a stress test many years ago at [facility] in [place] for evaluation of palpitations and abnormal EKG. Mark Palmer reports the stress test was normal and his symptoms were attributed to anxiety. Aside from the palpitations, he has not had any chest pain, shortness of breath, lightheadedness, orthopnea, PND, or edema. He works as a Engineer, site at [employer]. He feels as though some of his symptoms began after he moved [employer] following his [employer]. He has also gained about 15 pounds during this  time.  --------------------------------------------------------------------------------------------------  Cardiovascular History & Procedures: Cardiovascular Problems:  Palpitations  Risk Factors:  Obesity  Cath/PCI:  None  CV Surgery:  None  EP Procedures and Devices:  48-hour Holter monitor (03/24/16): Normal sinus rhythm with rare PVCs and PACs. No significant arrhythmias.  Non-Invasive Evaluation(s):  TTE (03/24/16): Normal LV size and function with LVEF 60-65% and normal wall motion. Normal RV size and function. No significant valvular abnormalities.  Recent CV Pertinent Labs: Lab Results  Component Value Date   CHOL 177 01/24/2016   HDL 43 01/24/2016   LDLCALC 98 01/24/2016   TRIG 180 (H) 01/24/2016   CHOLHDL 4.1 01/24/2016   K 3.5 11/10/2016   BUN 14 11/10/2016   CREATININE 1.06 11/10/2016   CREATININE 1.07 01/24/2016    --------------------------------------------------------------------------------------------------  Past Medical History:  Diagnosis Date  . Anxiety   . Depression   . Environmental allergies   . GERD (gastroesophageal reflux disease)   . Psoriasis     Past Surgical History:  Procedure Laterality Date  . SHOULDER ARTHROSCOPY WITH LABRAL REPAIR Right 2012  . WISDOM TOOTH EXTRACTION      Outpatient Encounter Prescriptions as of 12/09/2016  Medication Sig  . ALPRAZolam (XANAX) 0.5 MG tablet Take 1 tablet (0.5 mg total) by mouth 3 (three) times daily as needed for anxiety.  Marland Kitchen escitalopram (LEXAPRO) 20 MG tablet Take 1 tablet (20 mg total) by mouth daily.  . fluocinonide cream (LIDEX) 0.05 % Apply 1 application topically 2 (two) times daily.  . pantoprazole (PROTONIX) 40 MG tablet Take 1 tablet (40 mg total) by mouth daily.  No facility-administered encounter medications on file as of 12/09/2016.     Allergies: Amoxicillin  Social History   Social History  . Marital status: Single    Spouse name: N/A  . Number of children:  N/A  . Years of education: N/A   Occupational History  . Not on file.   Social History Main Topics  . Smoking status: Never Smoker  . Smokeless tobacco: Current User    Types: Snuff  . Alcohol use 0.0 - 0.6 oz/week     Comment: 1 drink every few months  . Drug use: No  . Sexual activity: Yes    Partners: Female   Other Topics Concern  . Not on file   Social History Narrative  . No narrative on file    Family History  Problem Relation Age of Onset  . Prostate cancer Maternal Grandfather   . Stroke Maternal Grandfather   . Diabetes Maternal Grandfather   . Heart attack Maternal Grandfather   . Hypertension Mother   . Mental illness Mother     anxiety  . Hyperlipidemia Mother   . Hypertension Maternal Uncle   . Heart disease Maternal Uncle   . Mental illness Maternal Aunt     anxiety  . Mental illness Maternal Aunt     anxiety  . Mental illness Maternal Uncle     anxiety  . Diabetes Maternal Uncle   . ADD / ADHD Sister   . Sudden Cardiac Death Neg Hx     Review of Systems: A 12-system review of systems was performed and was negative except as noted in the HPI.  --------------------------------------------------------------------------------------------------  Physical Exam: BP (!) 158/108 (BP Location: Left Arm, Patient Position: Sitting, Cuff Size: Normal)   Pulse 77   Ht 6' (1.829 m)   Wt 289 lb (131.1 kg)   BMI 39.20 kg/m   General:  Obese young man, seated comfortably in the exam room. HEENT: No conjunctival pallor or scleral icterus.  Moist mucous membranes.  OP clear. Neck: Supple without lymphadenopathy, thyromegaly, JVD, or HJR.  No carotid bruit. Lungs: Normal work of breathing.  Clear to auscultation bilaterally without wheezes or crackles. Heart: Regular rate and rhythm without murmurs, rubs, or gallops.  Unable to assess PMI due to body habitus. Abd: Bowel sounds present.  Soft, NT/ND without hepatosplenomegaly Ext: No lower extremity edema.   Radial, PT, and DP pulses are 2+ bilaterally Skin: warm and dry without rash Neuro: CNIII-XII intact.  Strength and fine-touch sensation intact in upper and lower extremities bilaterally. Psych: Normal mood and affect.  EKG:  Normal sinus rhythm without significant abnormalities.  Lab Results  Component Value Date   WBC 7.0 11/10/2016   HGB 14.2 11/10/2016   HCT 40.3 11/10/2016   MCV 84.6 11/10/2016   PLT 215 11/10/2016    Lab Results  Component Value Date   NA 138 11/10/2016   K 3.5 11/10/2016   CL 104 11/10/2016   CO2 27 11/10/2016   BUN 14 11/10/2016   CREATININE 1.06 11/10/2016   GLUCOSE 97 11/10/2016   ALT 12 03/13/2016    Lab Results  Component Value Date   CHOL 177 01/24/2016   HDL 43 01/24/2016   LDLCALC 98 01/24/2016   TRIG 180 (H) 01/24/2016   CHOLHDL 4.1 01/24/2016    --------------------------------------------------------------------------------------------------  ASSESSMENT AND PLAN: Palpitations Symptoms are sporadic and have improved with Xanax in the past. Echo and Holter monitor in 03/2016 were notable for a few PACs and PVCs as  well as mild sinus tachycardia. TSH was noted to be normal in 01/2016. Given that his symptoms have improved, we have agreed to defer additional testing and interventions. I have encouraged Mark Palmer to minimize his caffeine intake. He should continue following up with Dr. Adriana Simas for management of his anxiety.  Elevated blood pressure Blood pressure is moderately elevated today, though it has typically been upper normal to mildly elevated on previous checks in her system. We discussed the importance of lifestyle modification, including weight loss and salt restriction. If elevated blood pressure and palpitations remain a problem, we may need to pursue workup for secondary causes including pheochromocytoma.  Follow-up: Return to clinic in 3 months.  Yvonne Kendall, MD 12/10/2016 12:47 PM

## 2017-01-02 ENCOUNTER — Other Ambulatory Visit: Payer: Self-pay | Admitting: Family Medicine

## 2017-01-05 MED ORDER — ALPRAZOLAM 0.5 MG PO TABS: 0.5000 mg | ORAL_TABLET | Freq: Three times a day (TID) | ORAL | 0 refills | Status: DC | PRN

## 2017-01-05 NOTE — Telephone Encounter (Signed)
faxed

## 2017-01-05 NOTE — Telephone Encounter (Signed)
Last OV 01/24/16 last filled 01/24/16 30 0rf

## 2017-03-12 ENCOUNTER — Other Ambulatory Visit: Payer: Self-pay | Admitting: Family Medicine

## 2017-03-12 MED ORDER — CLOBETASOL PROPIONATE 0.05 % EX SHAM: MEDICATED_SHAMPOO | CUTANEOUS | 1 refills | Status: DC

## 2017-07-08 ENCOUNTER — Emergency Department: Payer: 59

## 2017-07-08 ENCOUNTER — Emergency Department
Admission: EM | Admit: 2017-07-08 | Discharge: 2017-07-09 | Disposition: A | Discharge status: Home / Self Care | Payer: 59 | Attending: Emergency Medicine | Admitting: Emergency Medicine

## 2017-07-08 DIAGNOSIS — F17220 Nicotine dependence, chewing tobacco, uncomplicated: Secondary | ICD-10-CM | POA: Diagnosis not present

## 2017-07-08 DIAGNOSIS — R002 Palpitations: Secondary | ICD-10-CM | POA: Insufficient documentation

## 2017-07-08 DIAGNOSIS — Z79899 Other long term (current) drug therapy: Secondary | ICD-10-CM | POA: Insufficient documentation

## 2017-07-08 DIAGNOSIS — I493 Ventricular premature depolarization: Secondary | ICD-10-CM | POA: Diagnosis not present

## 2017-07-08 DIAGNOSIS — R079 Chest pain, unspecified: Secondary | ICD-10-CM | POA: Diagnosis not present

## 2017-07-08 HISTORY — DX: Ventricular premature depolarization: I49.3

## 2017-07-08 LAB — BASIC METABOLIC PANEL
ANION GAP: 7 (ref 5–15)
BUN: 15 mg/dL (ref 6–20)
CALCIUM: 9.1 mg/dL (ref 8.9–10.3)
CO2: 24 mmol/L (ref 22–32)
CREATININE: 1.09 mg/dL (ref 0.61–1.24)
Chloride: 104 mmol/L (ref 101–111)
GFR calc Af Amer: >60 mL/min (ref >60)
GLUCOSE: 108 mg/dL — AB (ref 65–99)
Potassium: 3.9 mmol/L (ref 3.5–5.1)
Sodium: 135 mmol/L (ref 135–145)

## 2017-07-08 LAB — CBC
HCT: 43.2 % (ref 40.0–52.0)
Hemoglobin: 15 g/dL (ref 13.0–18.0)
MCH: 29.6 pg (ref 26.0–34.0)
MCHC: 34.8 g/dL (ref 32.0–36.0)
MCV: 85 fL (ref 80.0–100.0)
PLATELETS: 235 10*3/uL (ref 150–440)
RBC: 5.08 MIL/uL (ref 4.40–5.90)
RDW: 12.5 % (ref 11.5–14.5)
WBC: 9.2 10*3/uL (ref 3.8–10.6)

## 2017-07-08 LAB — TROPONIN I

## 2017-07-08 LAB — MAGNESIUM: MAGNESIUM: 2 mg/dL (ref 1.7–2.4)

## 2017-07-08 NOTE — ED Provider Notes (Signed)
[facility] Emergency Department Provider Note  ____________________________________________   First MD Initiated Contact with Patient 07/08/17 2259     (approximate)  I have reviewed the triage vital signs and the nursing notes.   HISTORY  Chief Complaint Chest Pain    HPI Mark Palmer is a 26 y.o. male with a history of PVCs who I saw approximately 7 months ago and who has been evaluated both by his PCP and by Dr. and with cardiology.  He presents tonight for further evaluation of what he describes as PVCs and palpitations leading to some chest tightness and occasional shortness of breath, or a "hitch" in his chest when the palpitations occur they cause him to gasp slightly.  He says that it leads to a pounding from his chest up into his head and a headache.  He has tried taking 1 mg of Xanax which sometimes helps because it is thought that stress compounds the issue, but the Xanax did not help tonight.  He feels like the PVCs are occurring about once every 4 beats.  He states that the symptoms got quite a bit better after getting to the emergency department.  Nothing in particular makes it better or worse.  He describes the symptoms as severe.  He reports that he has been going through a stressful time recently including closing on a house and less sleep than usual which may contribute.  He denies any increased use of caffeine recently.  Past Medical History:  Diagnosis Date  . Anxiety   . Depression   . Environmental allergies   . GERD (gastroesophageal reflux disease)   . Psoriasis   . PVC's (premature ventricular contractions)     Patient Active Problem List   Diagnosis Date Noted  . Anxiety 01/27/2016  . Palpitations 01/27/2016    Past Surgical History:  Procedure Laterality Date  . SHOULDER ARTHROSCOPY WITH LABRAL REPAIR Right 2012  . WISDOM TOOTH EXTRACTION      Prior to Admission medications   Medication Sig Start Date End Date Taking?  Authorizing Provider  ALPRAZolam Prudy Feeler) 0.5 MG tablet Take 1 tablet (0.5 mg total) by mouth 3 (three) times daily as needed for anxiety. 01/05/17   Tommie Sams, DO  Clobetasol Propionate 0.05 % shampoo Use daily. 03/12/17   Tommie Sams, DO  escitalopram (LEXAPRO) 20 MG tablet Take 1 tablet (20 mg total) by mouth daily. 07/06/16   Tommie Sams, DO  pantoprazole (PROTONIX) 40 MG tablet Take 1 tablet (40 mg total) by mouth daily. 07/06/16   Tommie Sams, DO    Allergies Amoxicillin  Family History  Problem Relation Age of Onset  . Prostate cancer Maternal Grandfather   . Stroke Maternal Grandfather   . Diabetes Maternal Grandfather   . Heart attack Maternal Grandfather   . Hypertension Mother   . Mental illness Mother        anxiety  . Hyperlipidemia Mother   . Hypertension Maternal Uncle   . Heart disease Maternal Uncle   . Mental illness Maternal Aunt        anxiety  . Mental illness Maternal Aunt        anxiety  . Mental illness Maternal Uncle        anxiety  . Diabetes Maternal Uncle   . ADD / ADHD Sister   . Sudden Cardiac Death Neg Hx     Social History Social History   Tobacco Use  . Smoking status: Never  Smoker  . Smokeless tobacco: Current User    Types: Snuff  Substance Use Topics  . Alcohol use: Yes    Alcohol/week: 0.0 - 0.6 oz    Comment: 1 drink every few months  . Drug use: No    Review of Systems Constitutional: No fever/chills Eyes: No visual changes. ENT: No sore throat. Cardiovascular: Frequent PVCs/palpitations leading to chest tightness Respiratory: Occasional shortness of breath as a result of PVCs and chest tightness Gastrointestinal: No abdominal pain.  No nausea, no vomiting.  No diarrhea.  No constipation. Genitourinary: Negative for dysuria. Musculoskeletal: Negative for neck pain.  Negative for back pain. Integumentary: Negative for rash. Neurological: Negative for headaches, focal weakness or  numbness.   ____________________________________________   PHYSICAL EXAM:  VITAL SIGNS: ED Triage Vitals  Enc Vitals Group     BP 07/08/17 2023 (!) 146/88     Pulse Rate 07/08/17 2023 97     Resp 07/08/17 2023 16     Temp 07/08/17 2023 99 F (37.2 C)     Temp Source 07/08/17 2023 Oral     SpO2 07/08/17 2023 98 %     Weight 07/08/17 2017 130.6 kg (288 lb)     Height --      Head Circumference --      Peak Flow --      Pain Score 07/08/17 2017 4     Pain Loc --      Pain Edu? --      Excl. in GC? --     Constitutional: Alert and oriented. Well appearing and in no acute distress. Eyes: Conjunctivae are normal.  Head: Atraumatic. Nose: No congestion/rhinnorhea. Mouth/Throat: Mucous membranes are moist. Neck: No stridor.  No meningeal signs.   Cardiovascular: Normal rate, regular rhythm. Good peripheral circulation. Grossly normal heart sounds.  Occasional PVC on the monitor Respiratory: Normal respiratory effort.  No retractions. Lungs CTAB. Gastrointestinal: Soft and nontender. No distention.  Musculoskeletal: No lower extremity tenderness nor edema. No gross deformities of extremities. Neurologic:  Normal speech and language. No gross focal neurologic deficits are appreciated.  Skin:  Skin is warm, dry and intact. No rash noted. Psychiatric: Mood and affect are normal. Speech and behavior are normal.  ____________________________________________   LABS (all labs ordered are listed, but only abnormal results are displayed)  Labs Reviewed  BASIC METABOLIC PANEL - Abnormal; Notable for the following components:      Result Value   Glucose, Bld 108 (*)    All other components within normal limits  CBC  TROPONIN I  MAGNESIUM   ____________________________________________  EKG  ED ECG REPORT I, Loleta Roseory Vashti Bolanos, the attending physician, personally viewed and interpreted this ECG.  Date: 07/08/2017 EKG Time: 20: 15 Rate: 97 Rhythm: normal sinus rhythm with  occasional PVC QRS Axis: normal Intervals: normal ST/T Wave abnormalities: normal Narrative Interpretation: no evidence of acute ischemia  ____________________________________________  RADIOLOGY   Dg Chest 2 View  Result Date: 07/08/2017 CLINICAL DATA:  Chest pain. EXAM: CHEST  2 VIEW COMPARISON:  Radiographs 11/10/2016 FINDINGS: The cardiomediastinal contours are normal. Subsegmental atelectasis or scarring in the lingula and right middle lobe. Pulmonary vasculature is normal. No consolidation, pleural effusion, or pneumothorax. No acute osseous abnormalities are seen. IMPRESSION: No active cardiopulmonary disease. Electronically Signed   By: Rubye OaksMelanie  Ehinger M.D.   On: 07/08/2017 20:59    ____________________________________________   PROCEDURES  Critical Care performed: No   Procedure(s) performed:   Procedures   ____________________________________________   INITIAL  IMPRESSION / ASSESSMENT AND PLAN / ED COURSE  As part of my medical decision making, I reviewed the following data within the electronic MEDICAL RECORD NUMBER Nursing notes reviewed and incorporated, EKG interpreted , Old chart reviewed and Notes from prior ED visits    Differential diagnosis includes, but is not limited to, ACS, aortic dissection, pulmonary embolism, cardiac tamponade, pneumothorax, pneumonia, pericarditis, myocarditis, GI-related causes including esophagitis/gastritis, and musculoskeletal chest wall pain.  However the patient's workup is reassuring.  His vital signs are within normal limits and his labs are all within normal limits as well.  I have added on a magnesium level but there is no indication of any electrolyte abnormality that is contributing although he felt them much more frequent earlier, they are now occurring much less frequency, with only one PVC showing up on his twelve-lead and only occasionally occurring on the monitor during our interview.  Had my usual and customary  PVC/palpitations discussion with him and explained that there is no evidence of any acute or emergent medical condition tonight.  We discussed how anxiety over the sensation of the PVCs is likely causing worsening of his symptoms and how it is reassuring that they got much better after he came to the emergency department.  We also discussed how I am not comfortable at this time writing a PRN beta-blocker for him and that even though he is in the emergency department his blood pressure is still quite appropriate, but he may benefit from seeing Dr. Okey DupreEnd in clinic again and having this discussion another time.  Apparently they discussed this in the past and since he still feels symptomatic he may benefit from further outpatient workup.  I [facility] a [facility] through [facility] to the cardiologist to bring this issue to his attention.  The she is comfortable with the plan to go home and follow-up as an outpatient once his magnesium level has been confirmed as normal.  I gave my usual and customary return precautions.  Clinical Course as of Jul 09 53  Thu Jul 08, 2017  2354 Normal mag, discharging as per plan described above Magnesium: 2.0 [CF]    Clinical Course User Index [CF] Loleta RoseForbach, Belmont Valli, MD    ____________________________________________  FINAL CLINICAL IMPRESSION(S) / ED DIAGNOSES  Final diagnoses:  Palpitations  PVC's (premature ventricular contractions)     MEDICATIONS GIVEN DURING THIS VISIT:  Medications - No data to display   ED Discharge Orders    None       Note:  This document was prepared using Dragon voice recognition software and may include unintentional dictation errors.    Loleta RoseForbach, Deandra Gadson, MD 07/09/17 [phone]

## 2017-07-08 NOTE — ED Triage Notes (Signed)
Patient states: "I'm having PVC's. It's going all the way up to my head". Patient reports hx of the same, however reports today's episode is worse.    Patient c/o central chest discomfort described as soreness and pressure. Patient reports concurrent symptoms of lightheadedness, SOB, weakness.

## 2017-07-08 NOTE — Discharge Instructions (Signed)
Your workup in the Emergency Department today was reassuring.  We did not find any specific abnormalities that are resulting in an increased frequency of your PVCs.  We recommend you drink plenty of fluids, take your regular medications and/or any new ones prescribed today, and follow up with the doctor(s) listed in these documents as recommended.  Return to the Emergency Department if you develop new or worsening symptoms that concern you.

## 2017-07-09 ENCOUNTER — Other Ambulatory Visit: Payer: Self-pay | Admitting: Internal Medicine

## 2017-07-09 ENCOUNTER — Telehealth: Payer: Self-pay | Admitting: Internal Medicine

## 2017-07-09 ENCOUNTER — Other Ambulatory Visit: Payer: Self-pay

## 2017-07-09 MED ORDER — ESCITALOPRAM OXALATE 20 MG PO TABS: 20.0000 mg | ORAL_TABLET | Freq: Every day | ORAL | 3 refills | Status: DC

## 2017-07-09 MED ORDER — PANTOPRAZOLE SODIUM 40 MG PO TBEC: 40.0000 mg | DELAYED_RELEASE_TABLET | Freq: Every day | ORAL | 3 refills | Status: DC

## 2017-07-09 MED ORDER — PROPRANOLOL HCL 10 MG PO TABS: 10.0000 mg | ORAL_TABLET | Freq: Three times a day (TID) | ORAL | 0 refills | Status: DC

## 2017-07-09 NOTE — Telephone Encounter (Signed)
Lmov for patient  They were seen in ED on 07/08/17 for CP  Pt has seen Dr End in past  Will need to try again

## 2017-07-09 NOTE — Telephone Encounter (Signed)
Please advise 

## 2017-07-09 NOTE — Telephone Encounter (Signed)
Pt scheduled. Nothing else needed.

## 2017-07-27 ENCOUNTER — Other Ambulatory Visit: Payer: Self-pay | Admitting: Internal Medicine

## 2017-07-27 MED ORDER — PROPRANOLOL HCL 10 MG PO TABS: 10.0000 mg | ORAL_TABLET | Freq: Three times a day (TID) | ORAL | 3 refills | Status: DC

## 2017-07-27 NOTE — Telephone Encounter (Signed)
Refilled: 07/09/2017 Last OV: 06/09/2016 Next OV: 08/16/2017

## 2017-08-09 ENCOUNTER — Ambulatory Visit: Payer: 59 | Admitting: Nurse Practitioner

## 2017-08-12 ENCOUNTER — Telehealth: Payer: Self-pay

## 2017-08-12 MED ORDER — FLUOCINONIDE 0.05 % EX CREA: 1.0000 "application " | TOPICAL_CREAM | Freq: Two times a day (BID) | CUTANEOUS | 2 refills | Status: DC

## 2017-08-12 NOTE — Telephone Encounter (Signed)
refilled 

## 2017-08-16 ENCOUNTER — Ambulatory Visit: Payer: 59 | Admitting: Internal Medicine

## 2017-08-19 ENCOUNTER — Telehealth: Payer: Self-pay

## 2017-08-19 NOTE — Telephone Encounter (Signed)
Pt has been scheduled.  °

## 2017-08-19 NOTE — Telephone Encounter (Signed)
Can you please schedule pt for 08/25/2017 @ 11:30am with Dr. Darrick Huntsmanullo for a transfer of care.

## 2017-08-25 ENCOUNTER — Ambulatory Visit: Payer: 59 | Admitting: Internal Medicine

## 2017-08-25 ENCOUNTER — Ambulatory Visit (INDEPENDENT_AMBULATORY_CARE_PROVIDER_SITE_OTHER): Payer: 59

## 2017-08-25 ENCOUNTER — Encounter: Payer: Self-pay | Admitting: Internal Medicine

## 2017-08-25 VITALS — BP 122/70 | HR 81 | Temp 98.2°F | Resp 16 | Ht 72.0 in | Wt 310.0 lb

## 2017-08-25 DIAGNOSIS — Z79899 Other long term (current) drug therapy: Secondary | ICD-10-CM | POA: Diagnosis not present

## 2017-08-25 DIAGNOSIS — L409 Psoriasis, unspecified: Secondary | ICD-10-CM | POA: Diagnosis not present

## 2017-08-25 DIAGNOSIS — E669 Obesity, unspecified: Secondary | ICD-10-CM

## 2017-08-25 DIAGNOSIS — F419 Anxiety disorder, unspecified: Secondary | ICD-10-CM | POA: Diagnosis not present

## 2017-08-25 DIAGNOSIS — R002 Palpitations: Secondary | ICD-10-CM

## 2017-08-25 DIAGNOSIS — M79672 Pain in left foot: Secondary | ICD-10-CM

## 2017-08-25 NOTE — Progress Notes (Signed)
Subjective:  Patient ID: Mark Palmer, male    DOB: 1990-09-29  Age: 27 y.o. MRN: [id number]  CC: The primary encounter diagnosis was Pain of left heel. Diagnoses of Obesity (BMI 30.0-34.9), Long-term use of high-risk medication, Palpitations, Obesity, morbid, BMI 40.0-49.9 (HCC), Psoriasis, and Anxiety were also pertinent to this visit.  HPI Mark Palmer presents for follow up on ER visit Nov 29  for tachycardia .  Labs  And EKG reviewed,  All were normal  including troponin were normal. He has been taking inderal since Dec 18th and has had no more episodes of tachycardia   OV with cardiology was cancelled for Dec 31 by patient since inderal is working. He has had a prior evaluation in 2017 with holter monitor which showed PAC's and PVC's   Diet reviewed:  He has one caffeinated beverage at lunch (sweet tea) .  fast food  On average once or twice daily   He has psoriasis managed with fluicinonide cream 2 times daily   Left foot pain.  Occurs after periods of rest  Describes it as stabbing pain in the heel  That occurs with weight bearing. No history of trauma, but served for 8 years in the  [employer]ed [employer] .   GERD:  Chronic,  Takes PPI daily   No prior b12 or Vi d check    Outpatient Medications Prior to Visit  Medication Sig Dispense Refill  . ALPRAZolam (XANAX) 0.5 MG tablet Take 1 tablet (0.5 mg total) by mouth 3 (three) times daily as needed for anxiety. 30 tablet 0  . Clobetasol Propionate 0.05 % shampoo Use daily. 118 mL 1  . escitalopram (LEXAPRO) 20 MG tablet Take 1 tablet (20 mg total) by mouth daily. 90 tablet 3  . fluocinonide cream (LIDEX) 0.05 % Apply 1 application topically 2 (two) times daily. 30 g 2  . pantoprazole (PROTONIX) 40 MG tablet Take 1 tablet (40 mg total) by mouth daily. 90 tablet 3  . propranolol (INDERAL) 10 MG tablet Take 1 tablet (10 mg total) by mouth 3 (three) times daily. As needed for rapid heart rate 90 tablet 3   No facility-administered  medications prior to visit.     Review of Systems;  Patient denies headache, fevers, malaise, unintentional weight loss, skin rash, eye pain, sinus congestion and sinus pain, sore throat, dysphagia,  hemoptysis , cough, dyspnea, wheezing, chest pain, palpitations, orthopnea, edema, abdominal pain, nausea, melena, diarrhea, constipation, flank pain, dysuria, hematuria, urinary  Frequency, nocturia, numbness, tingling, seizures,  Focal weakness, Loss of consciousness,  Tremor, insomnia, depression, anxiety, and suicidal ideation.      Objective:  BP 122/70 (BP Location: Left Arm, Patient Position: Sitting, Cuff Size: Large)   Pulse 81   Temp 98.2 F (36.8 C) (Oral)   Resp 16   Ht 6' (1.829 m)   Wt (!) 310 lb (140.6 kg)   SpO2 97%   BMI 42.04 kg/m   BP Readings from Last 3 Encounters:  08/25/17 122/70  07/09/17 120/83  12/09/16 (!) 158/108    Wt Readings from Last 3 Encounters:  08/25/17 (!) 310 lb (140.6 kg)  07/08/17 288 lb (130.6 kg)  12/09/16 289 lb (131.1 kg)    General appearance: alert, cooperative and appears stated age Ears: normal TM's and external ear canals both ears Throat: lips, mucosa, and tongue normal; teeth and gums normal Neck: no adenopathy, no carotid bruit, supple, symmetrical, trachea midline and thyroid not enlarged, symmetric, no tenderness/mass/nodules Back: symmetric,  no curvature. ROM normal. No CVA tenderness. Lungs: clear to auscultation bilaterally Heart: regular rate and rhythm, S1, S2 normal, no murmur, click, rub or gallop Abdomen: soft, non-tender; bowel sounds normal; no masses,  no organomegaly Pulses: 2+ and symmetric Skin: Skin color, texture, turgor normal. No rashes or lesions Lymph nodes: Cervical, supraclavicular, and axillary nodes normal.  Lab Results  Component Value Date   HGBA1C 5.3 01/24/2016    Lab Results  Component Value Date   CREATININE 1.09 07/08/2017   CREATININE 1.06 11/10/2016   CREATININE 1.12 03/13/2016     Lab Results  Component Value Date   WBC 9.2 07/08/2017   HGB 15.0 07/08/2017   HCT 43.2 07/08/2017   PLT 235 07/08/2017   GLUCOSE 108 (H) 07/08/2017   CHOL 177 01/24/2016   TRIG 180 (H) 01/24/2016   HDL 43 01/24/2016   LDLCALC 98 01/24/2016   ALT 12 03/13/2016   AST 20 03/13/2016   NA 135 07/08/2017   K 3.9 07/08/2017   CL 104 07/08/2017   CREATININE 1.09 07/08/2017   BUN 15 07/08/2017   CO2 24 07/08/2017   TSH 1.34 01/24/2016   HGBA1C 5.3 01/24/2016    Dg Chest 2 View  Result Date: 07/08/2017 CLINICAL DATA:  Chest pain. EXAM: CHEST  2 VIEW COMPARISON:  Radiographs 11/10/2016 FINDINGS: The cardiomediastinal contours are normal. Subsegmental atelectasis or scarring in the lingula and right middle lobe. Pulmonary vasculature is normal. No consolidation, pleural effusion, or pneumothorax. No acute osseous abnormalities are seen. IMPRESSION: No active cardiopulmonary disease. Electronically Signed   By: Rubye Oaks M.D.   On: 07/08/2017 20:59    Assessment & Plan:   Problem List Items Addressed This Visit    Anxiety    Managed with lexapro for years,  Has not used #30 prn alprazolam  In 7 months.       Obesity, morbid, BMI 40.0-49.9 (HCC)    I have addressed  BMI and recommended a low glycemic index diet utilizing smaller more frequent meals to increase metabolism.  I have also recommended that patient start exercising with a goal of 30 minutes of aerobic exercise a minimum of 5 days per week. Screening for lipid disorders, thyroid and diabetes to be done        Palpitations    Managed with low dose inderal.  Advised to reduce caffeine,  Start exercising,  Need to consider sleep apnea as a potential contributor given his BMI of 40      Psoriasis    Psoriasis involves scalp and is improving with resuming application of fluicinonide cream .  Advised to use it bid.        Other Visit Diagnoses    Pain of left heel    -  Primary   Relevant Orders   DG Foot  Complete Left (Completed)   Obesity (BMI 30.0-34.9)       Relevant Orders   Hemoglobin A1c   Lipid panel   Long-term use of high-risk medication       Relevant Orders   Vitamin B12   VITAMIN D 25 Hydroxy (Vit-D Deficiency, Fractures)     A total of 25 minutes of face to face time was spent with patient more than half of which was spent in counselling about the above mentioned conditions  and coordination of care  I am having Calix T. Biehl maintain his ALPRAZolam, Clobetasol Propionate, escitalopram, pantoprazole, propranolol, and fluocinonide cream.  No orders of the defined types were placed  in this encounter.   There are no discontinued medications.  Follow-up: No Follow-up on file.   Sherlene Shamseresa L Shawndale Kilpatrick, MD

## 2017-08-25 NOTE — Patient Instructions (Addendum)
Heel x ray ordered. I suspect a bone spur is the cause of your pain,  But arthritis and plantar fasciitis are also potential   You can try using Turmeric 500 mg twice daily ,  Or meloxicam 15 mg once daily   continue inderal twice daily for the palpitations  Return for fasting labs including a1c

## 2017-08-26 ENCOUNTER — Encounter: Payer: Self-pay | Admitting: Internal Medicine

## 2017-08-26 DIAGNOSIS — L409 Psoriasis, unspecified: Secondary | ICD-10-CM | POA: Insufficient documentation

## 2017-08-26 NOTE — Assessment & Plan Note (Signed)
Managed with low dose inderal.  Advised to reduce caffeine,  Start exercising,  Need to consider sleep apnea as a potential contributor given his BMI of 40

## 2017-08-26 NOTE — Assessment & Plan Note (Signed)
Managed with lexapro for years,  Has not used #30 prn alprazolam  In 7 months.

## 2017-08-26 NOTE — Assessment & Plan Note (Signed)
Psoriasis involves scalp and is improving with resuming application of fluicinonide cream .  Advised to use it bid.

## 2017-08-26 NOTE — Assessment & Plan Note (Signed)
I have addressed  BMI and recommended a low glycemic index diet utilizing smaller more frequent meals to increase metabolism.  I have also recommended that patient start exercising with a goal of 30 minutes of aerobic exercise a minimum of 5 days per week. Screening for lipid disorders, thyroid and diabetes to be done   

## 2017-09-15 DIAGNOSIS — H5213 Myopia, bilateral: Secondary | ICD-10-CM | POA: Diagnosis not present

## 2017-09-27 ENCOUNTER — Other Ambulatory Visit (INDEPENDENT_AMBULATORY_CARE_PROVIDER_SITE_OTHER): Payer: 59

## 2017-09-27 DIAGNOSIS — E669 Obesity, unspecified: Secondary | ICD-10-CM | POA: Diagnosis not present

## 2017-09-27 DIAGNOSIS — Z79899 Other long term (current) drug therapy: Secondary | ICD-10-CM | POA: Diagnosis not present

## 2017-09-27 LAB — LIPID PANEL
CHOLESTEROL: 232 mg/dL — AB (ref 0–200)
HDL: 30.4 mg/dL — ABNORMAL LOW (ref >39.00)
Total CHOL/HDL Ratio: 8

## 2017-09-27 LAB — LDL CHOLESTEROL, DIRECT: Direct LDL: 97 mg/dL

## 2017-09-27 LAB — HEMOGLOBIN A1C: Hgb A1c MFr Bld: 5.5 % (ref 4.6–6.5)

## 2017-09-27 LAB — VITAMIN B12: Vitamin B-12: 279 pg/mL (ref 211–911)

## 2017-09-27 LAB — VITAMIN D 25 HYDROXY (VIT D DEFICIENCY, FRACTURES): VITD: 13.66 ng/mL — AB (ref 30.00–100.00)

## 2017-09-29 ENCOUNTER — Encounter: Payer: Self-pay | Admitting: Internal Medicine

## 2017-09-29 ENCOUNTER — Other Ambulatory Visit: Payer: Self-pay | Admitting: Internal Medicine

## 2017-09-29 DIAGNOSIS — E782 Mixed hyperlipidemia: Secondary | ICD-10-CM

## 2017-09-29 DIAGNOSIS — E538 Deficiency of other specified B group vitamins: Secondary | ICD-10-CM

## 2017-09-29 MED ORDER — ERGOCALCIFEROL 1.25 MG (50000 UT) PO CAPS: 50000.0000 [IU] | ORAL_CAPSULE | ORAL | 0 refills | Status: DC

## 2017-09-29 MED ORDER — CYANOCOBALAMIN 1000 MCG/ML IJ SOLN: INTRAMUSCULAR | 1 refills | Status: DC

## 2017-10-15 ENCOUNTER — Other Ambulatory Visit: Payer: Self-pay | Admitting: Internal Medicine

## 2017-10-15 DIAGNOSIS — R0789 Other chest pain: Secondary | ICD-10-CM

## 2017-10-21 ENCOUNTER — Ambulatory Visit: Payer: 59

## 2017-10-29 ENCOUNTER — Ambulatory Visit
Admission: RE | Admit: 2017-10-29 | Discharge: 2017-10-29 | Disposition: A | Discharge status: Home / Self Care | Payer: 59 | Source: Ambulatory Visit | Attending: Internal Medicine | Admitting: Internal Medicine

## 2017-10-29 DIAGNOSIS — R0789 Other chest pain: Secondary | ICD-10-CM | POA: Insufficient documentation

## 2017-10-29 DIAGNOSIS — K760 Fatty (change of) liver, not elsewhere classified: Secondary | ICD-10-CM | POA: Diagnosis not present

## 2017-10-29 DIAGNOSIS — M25512 Pain in left shoulder: Secondary | ICD-10-CM | POA: Diagnosis not present

## 2017-11-22 ENCOUNTER — Other Ambulatory Visit: Payer: Self-pay

## 2017-11-22 MED ORDER — FLUOCINONIDE 0.05 % EX CREA: 1.0000 "application " | TOPICAL_CREAM | Freq: Two times a day (BID) | CUTANEOUS | 2 refills | Status: DC

## 2017-12-13 ENCOUNTER — Other Ambulatory Visit: Payer: Self-pay | Admitting: Internal Medicine

## 2017-12-13 DIAGNOSIS — K047 Periapical abscess without sinus: Secondary | ICD-10-CM

## 2017-12-13 MED ORDER — CLINDAMYCIN HCL 300 MG PO CAPS: ORAL_CAPSULE | ORAL | 0 refills | Status: DC

## 2017-12-13 MED ORDER — CLINDAMYCIN HCL 150 MG PO CAPS: ORAL_CAPSULE | ORAL | 0 refills | Status: DC

## 2017-12-13 NOTE — Progress Notes (Signed)
Assessed pt with cervical lymphadenopathy x 1 week  He does dip tobacco  Assessed teeth and had filling out in left upper molar and periodontal disease  Will try trial of clindamycin 450 mg tid x 1 week  He had sch appt with dentist  Prn Tylenol and Ibuprofen for reactive lymphadenopathy  If this does not resolved will need to see ENT   TMS

## 2018-02-18 ENCOUNTER — Encounter: Payer: Self-pay | Admitting: Internal Medicine

## 2018-02-21 ENCOUNTER — Other Ambulatory Visit: Payer: Self-pay | Admitting: Internal Medicine

## 2018-02-21 DIAGNOSIS — I493 Ventricular premature depolarization: Secondary | ICD-10-CM

## 2018-02-21 DIAGNOSIS — R002 Palpitations: Secondary | ICD-10-CM

## 2018-02-22 ENCOUNTER — Other Ambulatory Visit: Payer: Self-pay | Admitting: Internal Medicine

## 2018-02-22 ENCOUNTER — Ambulatory Visit: Payer: 59 | Admitting: Internal Medicine

## 2018-02-22 ENCOUNTER — Encounter: Payer: Self-pay | Admitting: Internal Medicine

## 2018-02-22 VITALS — BP 126/84 | HR 94 | Temp 98.1°F | Resp 15 | Ht 72.0 in | Wt 326.0 lb

## 2018-02-22 DIAGNOSIS — R7301 Impaired fasting glucose: Secondary | ICD-10-CM

## 2018-02-22 DIAGNOSIS — Z6835 Body mass index (BMI) 35.0-35.9, adult: Secondary | ICD-10-CM

## 2018-02-22 DIAGNOSIS — E538 Deficiency of other specified B group vitamins: Secondary | ICD-10-CM

## 2018-02-22 DIAGNOSIS — R002 Palpitations: Secondary | ICD-10-CM

## 2018-02-22 DIAGNOSIS — E781 Pure hyperglyceridemia: Secondary | ICD-10-CM

## 2018-02-22 DIAGNOSIS — R0683 Snoring: Secondary | ICD-10-CM | POA: Diagnosis not present

## 2018-02-22 DIAGNOSIS — E782 Mixed hyperlipidemia: Secondary | ICD-10-CM | POA: Diagnosis not present

## 2018-02-22 MED ORDER — METOPROLOL SUCCINATE ER 25 MG PO TB24: 25.0000 mg | ORAL_TABLET | Freq: Every day | ORAL | 3 refills | Status: DC

## 2018-02-22 NOTE — Progress Notes (Unsigned)
.  h 

## 2018-02-22 NOTE — Progress Notes (Signed)
Subjective:  Patient ID: Mark Palmer, male    DOB: 1990-08-24  Age: 27 y.o. MRN: [id number]  CC: The primary encounter diagnosis was Snoring. Diagnoses of Palpitations, Impaired fasting glucose, B12 deficiency, Elevated cholesterol with elevated triglycerides, Class 2 severe obesity due to excess calories with serious comorbidity and body mass index (BMI) of 35.0 to 35.9 in adult [facility]), Obesity, morbid, BMI 40.0-49.9 (HCC), and Hypertriglyceridemia were also pertinent to this visit.  HPI Mark Palmer presents for increased ectopy; has been occurring for several months   Causes headache when he gets frequent runs.  Has not had chest pain, dizziness or shortness of breath.  Has not noticed any episodes when he is physically active, working in the yard or at his job ,  only when sitting quietly.  Has not limited his caffeine.    Has signs and symptoms of sleep apnea  Which have been observed  by girlfriend   Takes inderal and zyrtec in the am.  Takes protonix at 11:00 a m.  Takes 2nd odse of propranolol  around 7 pm   Outpatient Medications Prior to Visit  Medication Sig Dispense Refill  . ALPRAZolam (XANAX) 0.5 MG tablet Take 1 tablet (0.5 mg total) by mouth 3 (three) times daily as needed for anxiety. 30 tablet 0  . escitalopram (LEXAPRO) 20 MG tablet Take 1 tablet (20 mg total) by mouth daily. 90 tablet 3  . fluocinonide cream (LIDEX) 0.05 % Apply 1 application topically 2 (two) times daily. 30 g 2  . pantoprazole (PROTONIX) 40 MG tablet Take 1 tablet (40 mg total) by mouth daily. 90 tablet 3  . propranolol (INDERAL) 10 MG tablet Take 1 tablet (10 mg total) by mouth 3 (three) times daily. As needed for rapid heart rate 90 tablet 3  . cyanocobalamin (,VITAMIN B-12,) 1000 MCG/ML injection 1 ml injected IM  weekly x 3,  Then monthly (Patient not taking: Reported on 02/22/2018) 10 mL 1  . clindamycin (CLEOCIN) 150 MG capsule 450 mg tid x 1 week (Patient not taking: Reported on 02/22/2018) 21  capsule 0  . clindamycin (CLEOCIN) 300 MG capsule 450 mg tid x 1 week (Patient not taking: Reported on 02/22/2018) 21 capsule 0  . Clobetasol Propionate 0.05 % shampoo Use daily. (Patient not taking: Reported on 02/22/2018) 118 mL 1  . ergocalciferol (DRISDOL) 50000 units capsule Take 1 capsule (50,000 Units total) by mouth once a week. (Patient not taking: Reported on 02/22/2018) 12 capsule 0   No facility-administered medications prior to visit.     Review of Systems;  Patient denies headache, fevers, malaise, unintentional weight loss, skin rash, eye pain, sinus congestion and sinus pain, sore throat, dysphagia,  hemoptysis , cough, dyspnea, wheezing, chest pain, palpitations, orthopnea, edema, abdominal pain, nausea, melena, diarrhea, constipation, flank pain, dysuria, hematuria, urinary  Frequency, nocturia, numbness, tingling, seizures,  Focal weakness, Loss of consciousness,  Tremor, insomnia, depression, anxiety, and suicidal ideation.      Objective:  BP 126/84 (BP Location: Left Arm, Patient Position: Sitting, Cuff Size: Large)   Pulse 94   Temp 98.1 F (36.7 C) (Oral)   Resp 15   Ht 6' (1.829 m)   Wt (!) 326 lb (147.9 kg)   SpO2 97%   BMI 44.21 kg/m   BP Readings from Last 3 Encounters:  02/22/18 126/84  08/25/17 122/70  07/09/17 120/83    Wt Readings from Last 3 Encounters:  02/22/18 (!) 326 lb (147.9 kg)  08/25/17 (!) 310  lb (140.6 kg)  07/08/17 288 lb (130.6 kg)    General appearance: alert, cooperative and appears stated age Ears: normal TM's and external ear canals both ears Throat: lips, mucosa, and tongue normal; teeth and gums normal Neck: no adenopathy, no carotid bruit, supple, symmetrical, trachea midline and thyroid not enlarged, symmetric, no tenderness/mass/nodules Back: symmetric, no curvature. ROM normal. No CVA tenderness. Lungs: clear to auscultation bilaterally Heart: regular rate and rhythm, S1, S2 normal, no murmur, click, rub or  gallop Abdomen: soft, non-tender; bowel sounds normal; no masses,  no organomegaly Pulses: 2+ and symmetric Skin: Skin color, texture, turgor normal. No rashes or lesions Lymph nodes: Cervical, supraclavicular, and axillary nodes normal.  Lab Results  Component Value Date   HGBA1C 5.7 02/22/2018   HGBA1C 5.5 09/27/2017   HGBA1C 5.3 01/24/2016    Lab Results  Component Value Date   CREATININE 1.04 02/22/2018   CREATININE 1.09 07/08/2017   CREATININE 1.06 11/10/2016    Lab Results  Component Value Date   WBC 9.2 07/08/2017   HGB 15.0 07/08/2017   HCT 43.2 07/08/2017   PLT 235 07/08/2017   GLUCOSE 96 02/22/2018   CHOL 223 (H) 02/22/2018   TRIG (H) 02/22/2018    592.0 Triglyceride is over 400; calculations on Lipids are invalid.   HDL 31.70 (L) 02/22/2018   LDLDIRECT 103.0 02/22/2018   LDLCALC 98 01/24/2016   ALT 13 02/22/2018   AST 19 02/22/2018   NA 136 02/22/2018   K 3.8 02/22/2018   CL 100 02/22/2018   CREATININE 1.04 02/22/2018   BUN 13 02/22/2018   CO2 29 02/22/2018   TSH 0.93 02/22/2018   HGBA1C 5.7 02/22/2018    [place] Abdomen Limited Ruq  Result Date: 10/29/2017 CLINICAL DATA:  Abdominal pain EXAM: ULTRASOUND ABDOMEN LIMITED RIGHT UPPER QUADRANT COMPARISON:  None. FINDINGS: Gallbladder: No gallstones or wall thickening visualized. No sonographic Murphy sign noted by sonographer. Common bile duct: Diameter: 2.8 mm. Liver: Slightly increased echogenicity without focal mass. This likely represents fatty infiltration. Portal vein is patent on color Doppler imaging with normal direction of blood flow towards the liver. IMPRESSION: Mild fatty infiltration of the liver. Electronically Signed   By: Alcide CleverMark  Lukens M.D.   On: 10/29/2017 11:06    Assessment & Plan:   Problem List Items Addressed This Visit    Palpitations    He has PVCs on today's EKG which I have interpreted as  otherwise normal,  But the PVCs are Increasing in frequency,  Suspect due to untreated sleep  apnea.  Sleep study ordered.  Advised to limit caffeine.  Changing inderal to Metoprolol XL 25 mg daily.  cardiology referral in progress.       Relevant Orders   EKG 12-Lead (Completed)   Ambulatory referral to Sleep Studies   Obesity, morbid, BMI 40.0-49.9 (HCC)    I have addressed  BMI and recommended wt loss of 10% of body weigh over the next 6 months using a low glycemic index diet and regular exercise a minimum of 5 days per week. Screening for diabetes,  thyroid and hyperlipidemia done today .  Lab Results  Component Value Date   HGBA1C 5.7 02/22/2018   Lab Results  Component Value Date   TSH 0.93 02/22/2018   Lab Results  Component Value Date   CHOL 223 (H) 02/22/2018   HDL 31.70 (L) 02/22/2018   LDLCALC 98 01/24/2016   LDLDIRECT 103.0 02/22/2018   TRIG (H) 02/22/2018    592.0 Triglyceride is  over 400; calculations on Lipids are invalid.   CHOLHDL 7 02/22/2018         Hypertriglyceridemia    Triglycerides are elevated today to over 300.  Recommended a low glycemic index diet, weight  loss and regular exercise.  Advised t o  repeat in 6 months.   Lab Results  Component Value Date   CHOL 223 (H) 02/22/2018   HDL 31.70 (L) 02/22/2018   LDLCALC 98 01/24/2016   LDLDIRECT 103.0 02/22/2018   TRIG (H) 02/22/2018    592.0 Triglyceride is over 400; calculations on Lipids are invalid.   CHOLHDL 7 02/22/2018        Relevant Medications   metoprolol succinate (TOPROL-XL) 25 MG 24 hr tablet    Other Visit Diagnoses    Snoring    -  Primary   Relevant Orders   Ambulatory referral to Sleep Studies   Impaired fasting glucose       B12 deficiency       Elevated cholesterol with elevated triglycerides       Relevant Medications   metoprolol succinate (TOPROL-XL) 25 MG 24 hr tablet   Class 2 severe obesity due to excess calories with serious comorbidity and body mass index (BMI) of 35.0 to 35.9 in adult [facility])       Relevant Orders   Ambulatory referral to Sleep Studies       I have discontinued Mal Amabile T. Kolker's Clobetasol Propionate, ergocalciferol, clindamycin, and clindamycin. I am also having him start on metoprolol succinate. Additionally, I am having him maintain his ALPRAZolam, escitalopram, pantoprazole, propranolol, cyanocobalamin, and fluocinonide cream.  Meds ordered this encounter  Medications  . metoprolol succinate (TOPROL-XL) 25 MG 24 hr tablet    Sig: Take 1 tablet (25 mg total) by mouth daily.    Dispense:  90 tablet    Refill:  3    Medications Discontinued During This Encounter  Medication Reason  . clindamycin (CLEOCIN) 150 MG capsule Allergic reaction  . clindamycin (CLEOCIN) 300 MG capsule Allergic reaction  . Clobetasol Propionate 0.05 % shampoo Patient has not taken in last 30 days  . ergocalciferol (DRISDOL) 50000 units capsule Completed Course    Follow-up: No follow-ups on file.   Sherlene Shams, MD

## 2018-02-22 NOTE — Addendum Note (Signed)
Addended by: Warden FillersWRIGHT, LATOYA S on: 02/22/2018 03:32 PM   Modules accepted: Orders

## 2018-02-22 NOTE — Addendum Note (Signed)
Addended by: Warden FillersWRIGHT, Colan Laymon S on: 02/22/2018 03:35 PM   Modules accepted: Orders

## 2018-02-22 NOTE — Patient Instructions (Signed)
I have ordered your sleep study   I am changing your propranolol to once daily Metoprolol. Take it once daily in the evening with  Your lexapro.   I want you to lose 30 lbs over the next 6 months   You need 30 minutes of cardio 5 days per week   No cereal,  No chick Fil A   Try Vilma MeckelJimmy Dean Fritta and  Eggwhich  Gerre PebblesOre Ida Just crack and Winn-DixieEgg   Lunch should be a pre mixed salad and add a protein  Form previous night  's dinner

## 2018-02-23 DIAGNOSIS — E781 Pure hyperglyceridemia: Secondary | ICD-10-CM | POA: Insufficient documentation

## 2018-02-23 LAB — COMPREHENSIVE METABOLIC PANEL
ALBUMIN: 4.3 g/dL (ref 3.5–5.2)
ALT: 13 U/L (ref 0–53)
AST: 19 U/L (ref 0–37)
Alkaline Phosphatase: 84 U/L (ref 39–117)
BUN: 13 mg/dL (ref 6–23)
CALCIUM: 9.1 mg/dL (ref 8.4–10.5)
CHLORIDE: 100 meq/L (ref 96–112)
CO2: 29 mEq/L (ref 19–32)
Creatinine, Ser: 1.04 mg/dL (ref 0.40–1.50)
GFR: 90.55 mL/min (ref >60.00)
Glucose, Bld: 96 mg/dL (ref 70–99)
POTASSIUM: 3.8 meq/L (ref 3.5–5.1)
SODIUM: 136 meq/L (ref 135–145)
Total Bilirubin: 0.9 mg/dL (ref 0.2–1.2)
Total Protein: 7.2 g/dL (ref 6.0–8.3)

## 2018-02-23 LAB — TSH: TSH: 0.93 u[IU]/mL (ref 0.35–4.50)

## 2018-02-23 LAB — HEMOGLOBIN A1C: HEMOGLOBIN A1C: 5.7 % (ref 4.6–6.5)

## 2018-02-23 LAB — LIPID PANEL
CHOLESTEROL: 223 mg/dL — AB (ref 0–200)
HDL: 31.7 mg/dL — ABNORMAL LOW (ref >39.00)
Total CHOL/HDL Ratio: 7
Triglycerides: 592 mg/dL — ABNORMAL HIGH (ref 0.0–149.0)

## 2018-02-23 LAB — VITAMIN B12: VITAMIN B 12: 331 pg/mL (ref 211–911)

## 2018-02-23 LAB — LDL CHOLESTEROL, DIRECT: LDL DIRECT: 103 mg/dL

## 2018-02-23 LAB — MAGNESIUM: Magnesium: 2.1 mg/dL (ref 1.5–2.5)

## 2018-02-23 NOTE — Assessment & Plan Note (Signed)
Triglycerides are elevated today to over 300.  Recommended a low glycemic index diet, weight  loss and regular exercise.  Advised t o  repeat in 6 months.   Lab Results  Component Value Date   CHOL 223 (H) 02/22/2018   HDL 31.70 (L) 02/22/2018   LDLCALC 98 01/24/2016   LDLDIRECT 103.0 02/22/2018   TRIG (H) 02/22/2018    592.0 Triglyceride is over 400; calculations on Lipids are invalid.   CHOLHDL 7 02/22/2018

## 2018-02-23 NOTE — Assessment & Plan Note (Addendum)
I have addressed  BMI and recommended wt loss of 10% of body weigh over the next 6 months using a low glycemic index diet and regular exercise a minimum of 5 days per week. Screening for diabetes,  thyroid and hyperlipidemia done today .  Lab Results  Component Value Date   HGBA1C 5.7 02/22/2018   Lab Results  Component Value Date   TSH 0.93 02/22/2018   Lab Results  Component Value Date   CHOL 223 (H) 02/22/2018   HDL 31.70 (L) 02/22/2018   LDLCALC 98 01/24/2016   LDLDIRECT 103.0 02/22/2018   TRIG (H) 02/22/2018    592.0 Triglyceride is over 400; calculations on Lipids are invalid.   CHOLHDL 7 02/22/2018

## 2018-02-23 NOTE — Assessment & Plan Note (Addendum)
He has PVCs on today's EKG which I have interpreted as  otherwise normal,  But the PVCs are Increasing in frequency,  Suspect due to untreated sleep apnea.  Sleep study ordered.  Advised to limit caffeine.  Changing inderal to Metoprolol XL 25 mg daily.  cardiology referral in progress.

## 2018-02-24 LAB — INTRINSIC FACTOR ANTIBODIES: INTRINSIC FACTOR: NEGATIVE

## 2018-02-24 LAB — FOLATE RBC: RBC Folate: 897 ng/mL RBC (ref >280)

## 2018-03-09 ENCOUNTER — Encounter: Payer: Self-pay | Admitting: Internal Medicine

## 2018-03-09 ENCOUNTER — Ambulatory Visit: Payer: 59 | Admitting: Internal Medicine

## 2018-03-09 ENCOUNTER — Ambulatory Visit: Payer: 59 | Attending: Neurology

## 2018-03-09 VITALS — BP 140/80 | HR 93 | Ht 72.0 in | Wt 324.0 lb

## 2018-03-09 DIAGNOSIS — G4733 Obstructive sleep apnea (adult) (pediatric): Secondary | ICD-10-CM | POA: Insufficient documentation

## 2018-03-09 DIAGNOSIS — E781 Pure hyperglyceridemia: Secondary | ICD-10-CM | POA: Diagnosis not present

## 2018-03-09 DIAGNOSIS — R002 Palpitations: Secondary | ICD-10-CM | POA: Diagnosis not present

## 2018-03-09 DIAGNOSIS — I493 Ventricular premature depolarization: Secondary | ICD-10-CM | POA: Diagnosis not present

## 2018-03-09 DIAGNOSIS — F5101 Primary insomnia: Secondary | ICD-10-CM | POA: Diagnosis not present

## 2018-03-09 MED ORDER — FISH OIL 1000 MG PO CAPS: 3.0000 | ORAL_CAPSULE | Freq: Every day | ORAL | 11 refills | Status: DC

## 2018-03-09 NOTE — Progress Notes (Signed)
Follow-up Outpatient Visit Date: 03/09/2018  Primary Care Provider: Crecencio Mc, MD [address] [place] [address]  Chief Complaint: Follow-up palpitations  HPI:  Mr. Quast is a 27 y.o. year-old male with history of anxiety, GERD, psoriasis, and allergies, who presents for follow-up of palpitations.  I met Mr. Tengan in 12/2016, at which time he reported intermittent palpitations.  Preceding echo and Holter monitor were notable for a few PACs and PVCs as well as mild sinus tachycardia.  Symptoms improved with alprazolam.  He was also on propranolol in the past.  No further intervention was recommended at that time.  Today, Mr. Cheuvront reports that his palpitations have been more frequent over the last few months.  He describes skipped beats happening sporadically throughout the day.  He was switched from propranolol to metoprolol succinate by Dr. Derrel Nip (PCP) earlier this week.  He has not felt any further palpitations, though he has noticed considerable fatigue with the metoprolol.  He has not taken it yet today, as he is planning to begin taking in the evening to see if that helps his fatigue.  He denies chest pain, lightheadedness, orthopnea, and edema.  Mr. Desa has gained ~35 pounds over the last year.  He reports stable shortness of breath with activity, which he attributes to deconditioning and weight gain.  He is scheduled for a sleep study tonight; his wife reports that Mr. Croswell snores quite a bit.  --------------------------------------------------------------------------------------------------  Cardiovascular History & Procedures: Cardiovascular Problems:  Palpitations and PVC's  Risk Factors:  Obesity  Cath/PCI:  None  CV Surgery:  None  EP Procedures and Devices:  48-hour Holter monitor (03/24/16): Normal sinus rhythm with rare PVCs and PACs. No significant arrhythmias.  Non-Invasive Evaluation(s):  TTE (03/24/16): Normal LV size and  function with LVEF 60-65% and normal wall motion. Normal RV size and function. No significant valvular abnormalities.   Recent CV Pertinent Labs: Lab Results  Component Value Date   CHOL 223 (H) 02/22/2018   HDL 31.70 (L) 02/22/2018   LDLCALC 98 01/24/2016   LDLDIRECT 103.0 02/22/2018   TRIG (H) 02/22/2018    592.0 Triglyceride is over 400; calculations on Lipids are invalid.   CHOLHDL 7 02/22/2018   K 3.8 02/22/2018   MG 2.1 02/22/2018   BUN 13 02/22/2018   CREATININE 1.04 02/22/2018   CREATININE 1.07 01/24/2016    Past medical and surgical history were reviewed and updated in EPIC.  Current Meds  Medication Sig  . ALPRAZolam (XANAX) 0.5 MG tablet Take 1 tablet (0.5 mg total) by mouth 3 (three) times daily as needed for anxiety.  Marland Kitchen escitalopram (LEXAPRO) 20 MG tablet Take 1 tablet (20 mg total) by mouth daily.  . fluocinonide cream (LIDEX) 3.84 % Apply 1 application topically 2 (two) times daily.  . metoprolol succinate (TOPROL-XL) 25 MG 24 hr tablet Take 1 tablet (25 mg total) by mouth daily.  . pantoprazole (PROTONIX) 40 MG tablet Take 1 tablet (40 mg total) by mouth daily.    Allergies: Clindamycin/lincomycin and Amoxicillin  Social History   Tobacco Use  . Smoking status: Never Smoker  . Smokeless tobacco: Current User    Types: Snuff  Substance Use Topics  . Alcohol use: Yes    Alcohol/week: 0.0 - 0.6 oz    Comment: 1 drink every few months  . Drug use: No    Family History  Problem Relation Age of Onset  . Prostate cancer Maternal Grandfather   . Stroke  Maternal Grandfather   . Diabetes Maternal Grandfather   . Heart attack Maternal Grandfather   . Hypertension Mother   . Mental illness Mother        anxiety  . Hyperlipidemia Mother   . Hypertension Maternal Uncle   . Heart disease Maternal Uncle   . Mental illness Maternal Aunt        anxiety  . Mental illness Maternal Aunt        anxiety  . Mental illness Maternal Uncle        anxiety  .  Diabetes Maternal Uncle   . Psoriasis Father   . ADD / ADHD Sister   . Sudden Cardiac Death Neg Hx     Review of Systems: A 12-system review of systems was performed and was negative except as noted in the HPI.  --------------------------------------------------------------------------------------------------  Physical Exam: BP 140/80 (BP Location: Left Arm, Patient Position: Sitting, Cuff Size: Large)   Pulse 93   Ht 6' (1.829 m)   Wt (!) 324 lb (147 kg)   BMI 43.94 kg/m   General:  Morbidly obese man, seated comfortably in the exam room. HEENT: No conjunctival pallor or scleral icterus. Moist mucous membranes.  OP clear. Neck: Supple without lymphadenopathy, thyromegaly, JVD, or HJR, though evaluation is limited by body habitus. Lungs: Normal work of breathing. Clear to auscultation bilaterally without wheezes or crackles. Heart: Regular rate and rhythm with occasional extrasystoles.  No murmurs, rubs, or gallops. Unable to assess PMI due to body habitus. Abd: Bowel sounds present. Soft, NT/ND.  Unable to assess HSM due to body habitus. Ext: No lower extremity edema. Radial, PT, and DP pulses are 2+ bilaterally. Skin: Warm and dry without rash.  EKG:  NSR with isolated PVC's.  Otherwise, no significant abnormalities.  Lab Results  Component Value Date   WBC 9.2 07/08/2017   HGB 15.0 07/08/2017   HCT 43.2 07/08/2017   MCV 85.0 07/08/2017   PLT 235 07/08/2017    Lab Results  Component Value Date   NA 136 02/22/2018   K 3.8 02/22/2018   CL 100 02/22/2018   CO2 29 02/22/2018   BUN 13 02/22/2018   CREATININE 1.04 02/22/2018   GLUCOSE 96 02/22/2018   ALT 13 02/22/2018    Lab Results  Component Value Date   CHOL 223 (H) 02/22/2018   HDL 31.70 (L) 02/22/2018   LDLCALC 98 01/24/2016   LDLDIRECT 103.0 02/22/2018   TRIG (H) 02/22/2018    592.0 Triglyceride is over 400; calculations on Lipids are invalid.   CHOLHDL 7 02/22/2018     --------------------------------------------------------------------------------------------------  ASSESSMENT AND PLAN: Palpitations and PVC's Increased over the last few months, though switching from propranolol to metoprolol succinate seems to have helped.  Unfortunately, Mr. Jacalyn Lefevre has experienced mare fatigue with metoprolol.  Taking this QHS may help.  If he notices less fatigue with QHS dosing, he could try increasing metoprolol succinate to 50 mg daily.  Recent K and Mg levels as well as TSH were normal.  I do not recommend any further testing at this time.  I agree with sleep study to evaluate for OSA.  Hypertriglyceridemia Triglycerides again noted to be >500.  I encouraged weight loss and avoidance of carbs.  I have recommended that Mr. Ou start fish oil 3,000 mg daily.  We should repeat a fasting lipid panel in ~3 months to assess response.  Morbid obesity Weight loss through diet and exercise encouraged.  Follow-up: Return to clinic in 3 months.  Nelva Bush, MD 03/09/2018 2:02 PM

## 2018-03-09 NOTE — Patient Instructions (Signed)
Medication Instructions:  Your physician has recommended you make the following change in your medication:  1- START Fish oil 3000mg  by mouth once a day. You may take 1000mg  three times a day if you wish.  2- If you still have palpitations and the fatigue gets better, you may INCREASE Metoprolol to 50 mg once a day.   Labwork: NONE  Testing/Procedures: NONE  Follow-Up: Your physician recommends that you schedule a follow-up appointment in: 3 MONTHS WITH DR END.   If you need a refill on your cardiac medications before your next appointment, please call your pharmacy.

## 2018-03-17 ENCOUNTER — Other Ambulatory Visit: Payer: Self-pay | Admitting: Internal Medicine

## 2018-03-17 DIAGNOSIS — G4733 Obstructive sleep apnea (adult) (pediatric): Secondary | ICD-10-CM

## 2018-03-17 NOTE — Progress Notes (Signed)
dme

## 2018-04-01 ENCOUNTER — Telehealth: Payer: Self-pay

## 2018-04-01 NOTE — Telephone Encounter (Signed)
Copied from CRM [phone]. Topic: Referral - Medical Records >> Apr 01, 2018  2:31 PM Lorayne BenderAlexander, Amber L wrote: Reason for CRM:  Lupita LeashDonna with [employer] calling.  They are assisting pt with CPAP machine.  They are missing the face to face note and the insurance information. Please fax to [phone]

## 2018-04-08 DIAGNOSIS — G4733 Obstructive sleep apnea (adult) (pediatric): Secondary | ICD-10-CM | POA: Diagnosis not present

## 2018-04-12 ENCOUNTER — Ambulatory Visit (INDEPENDENT_AMBULATORY_CARE_PROVIDER_SITE_OTHER): Payer: 59

## 2018-04-12 ENCOUNTER — Other Ambulatory Visit: Payer: Self-pay | Admitting: Internal Medicine

## 2018-04-12 DIAGNOSIS — M5440 Lumbago with sciatica, unspecified side: Secondary | ICD-10-CM | POA: Diagnosis not present

## 2018-04-12 DIAGNOSIS — M545 Low back pain: Secondary | ICD-10-CM | POA: Diagnosis not present

## 2018-04-12 DIAGNOSIS — S3992XA Unspecified injury of lower back, initial encounter: Secondary | ICD-10-CM | POA: Diagnosis not present

## 2018-04-12 MED ORDER — CELECOXIB 200 MG PO CAPS: 200.0000 mg | ORAL_CAPSULE | Freq: Two times a day (BID) | ORAL | 1 refills | Status: DC

## 2018-04-12 MED ORDER — TRAMADOL HCL 50 MG PO TABS: 50.0000 mg | ORAL_TABLET | Freq: Three times a day (TID) | ORAL | 0 refills | Status: DC | PRN

## 2018-04-13 ENCOUNTER — Ambulatory Visit (INDEPENDENT_AMBULATORY_CARE_PROVIDER_SITE_OTHER): Payer: 59

## 2018-04-13 ENCOUNTER — Other Ambulatory Visit: Payer: Self-pay | Admitting: Internal Medicine

## 2018-04-13 ENCOUNTER — Encounter: Payer: Self-pay | Admitting: Internal Medicine

## 2018-04-13 ENCOUNTER — Ambulatory Visit: Payer: 59 | Admitting: Internal Medicine

## 2018-04-13 ENCOUNTER — Ambulatory Visit: Payer: 59 | Admitting: Family

## 2018-04-13 VITALS — BP 110/74 | HR 78 | Temp 98.2°F | Resp 14 | Ht 72.0 in | Wt 325.0 lb

## 2018-04-13 DIAGNOSIS — S299XXA Unspecified injury of thorax, initial encounter: Secondary | ICD-10-CM | POA: Diagnosis not present

## 2018-04-13 DIAGNOSIS — M549 Dorsalgia, unspecified: Secondary | ICD-10-CM

## 2018-04-13 DIAGNOSIS — M546 Pain in thoracic spine: Secondary | ICD-10-CM | POA: Insufficient documentation

## 2018-04-13 LAB — URINALYSIS, ROUTINE W REFLEX MICROSCOPIC
BILIRUBIN URINE: NEGATIVE
Ketones, ur: NEGATIVE
Leukocytes, UA: NEGATIVE
Nitrite: NEGATIVE
PH: 6 (ref 5.0–8.0)
RBC / HPF: NONE SEEN (ref 0–?)
Specific Gravity, Urine: >=1.03 — AB (ref 1.000–1.030)
Total Protein, Urine: NEGATIVE
UROBILINOGEN UA: 0.2 (ref 0.0–1.0)
Urine Glucose: NEGATIVE
WBC UA: NONE SEEN (ref 0–?)
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
## 2018-04-13 NOTE — Progress Notes (Unsigned)
horacc

## 2018-04-13 NOTE — Patient Instructions (Addendum)
Do not take more than 800 mg motrin at one time and not more than 2400 mg daly  Ok to max out tylenol at 4000 mg for 48 hours,  Then reduce to 2000 mg daily  ICE ICE ICE ICE    Tramadol up to 4 daily for the next week    xrays of thoracic spin and right sided ribs

## 2018-04-13 NOTE — Addendum Note (Signed)
Addended by: Penne Lash on: 04/13/2018 01:47 PM   Modules accepted: Orders

## 2018-04-13 NOTE — Progress Notes (Signed)
Subjective:  Patient ID: Mark Palmer, male    DOB: [birth year]  Age: 27 y.o. MRN: [id number]  CC: The primary encounter diagnosis was Upper back pain on right side. A diagnosis of Acute midline thoracic back pain was also pertinent to this visit.  HPI [name] presents for acute back pain involving the mid thoracic area.  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  He fell onto cut lumbar.  No LOC.  After a few minutes of rest he resumed work. However he woke up in severe pain on Sunday and has been feeling a "click" in his upper back when he walks.  He is able to bend over.  Lifting arms above hear hurts after a while. Denies pain with deep breathing.   Sitting or standing hurts  the most ,  The pain does not radiate down either leg.  The spine above the area of impact has started to hurt and feels tight   He Has not started the celebrex or tramadol yet.  He received a dose of toradol on Sunday before bedtime which helped him rest.  Has been using tylenol and motrin since then  800 mg to 1000 mg of motrin and 1000 mg tylenol three times daily   Outpatient Medications Prior to Visit  Medication Sig Dispense Refill  . ALPRAZolam (XANAX) 0.5 MG tablet Take 1 tablet (0.5 mg total) by mouth 3 (three) times daily as needed for anxiety. 30 tablet 0  . escitalopram (LEXAPRO) 20 MG tablet Take 1 tablet (20 mg total) by mouth daily. 90 tablet 3  . fluocinonide cream (LIDEX) 0.05 % Apply 1 application topically 2 (two) times daily. 30 g 2  . metoprolol succinate (TOPROL-XL) 25 MG 24 hr tablet Take 1 tablet (25 mg total) by mouth daily. 90 tablet 3  . Omega-3 Fatty Acids (FISH OIL) 1000 MG CAPS Take 3 capsules (3,000 mg total) by mouth daily. 90 capsule 11  . pantoprazole (PROTONIX) 40 MG tablet Take 1 tablet (40 mg total) by mouth daily. 90 tablet 3  . celecoxib (CELEBREX) 200 MG capsule Take 1 capsule (200 mg total) by mouth 2 (two) times daily.  (Patient not taking: Reported on 04/13/2018) 60 capsule 1  . traMADol (ULTRAM) 50 MG tablet Take 1 tablet (50 mg total) by mouth every 8 (eight) hours as needed. (Patient not taking: Reported on 04/13/2018) 30 tablet 0   No facility-administered medications prior to visit.     Review of Systems;  Patient denies headache, fevers, malaise, unintentional weight loss, skin rash, eye pain, sinus congestion and sinus pain, sore throat, dysphagia,  hemoptysis , cough, dyspnea, wheezing, chest pain, palpitations, orthopnea, edema, abdominal pain, nausea, melena, diarrhea, constipation, flank pain, dysuria, hematuria, urinary  Frequency, nocturia, numbness, tingling, seizures,  Focal weakness, Loss of consciousness,  Tremor, insomnia, depression, anxiety, and suicidal ideation.      Objective:  BP 110/74 (BP Location: Left Arm, Patient Position: Sitting, Cuff Size: Large)   Pulse 78   Temp 98.2 F (36.8 C) (Oral)   Resp 14   Ht 6' (1.829 m)   Wt (!) 325 lb (147.4 kg)   SpO2 97%   BMI 44.08 kg/m   BP Readings from Last 3 Encounters:  04/13/18 110/74  03/09/18 140/80  02/22/18 126/84    Wt Readings from Last 3 Encounters:  04/13/18 (!) 325 lb (147.4 kg)  03/09/18 (!) 324 lb (147 kg)  02/22/18 (!) 326 lb (147.9 kg)    General appearance: alert, cooperative and appears stated age Ears: normal TM's and external ear canals both ears Throat: lips, mucosa, and tongue normal; teeth and gums normal Neck: no adenopathy, no carotid bruit, supple, symmetrical, trachea midline and thyroid not enlarged, symmetric, no tenderness/mass/nodules Back: Tender in the mid thoracic region on the right with paraspinus muscle spasm on the right .  No bruising noted.    Heart: regular rate and rhythm, S1, S2 normal, no murmur, click, rub or gallop Abdomen: soft, non-tender; bowel sounds normal; no masses,  no organomegaly Pulses: 2+ and symmetric Skin: Skin color, texture, turgor normal. No rashes or  lesions Lymph nodes: Cervical, supraclavicular, and axillary nodes normal.  Lab Results  Component Value Date   HGBA1C 5.7 02/22/2018   HGBA1C 5.5 09/27/2017   HGBA1C 5.3 01/24/2016    Lab Results  Component Value Date   CREATININE 1.04 02/22/2018   CREATININE 1.09 07/08/2017   CREATININE 1.06 11/10/2016    Lab Results  Component Value Date   WBC 9.2 07/08/2017   HGB 15.0 07/08/2017   HCT 43.2 07/08/2017   PLT 235 07/08/2017   GLUCOSE 96 02/22/2018   CHOL 223 (H) 02/22/2018   TRIG (H) 02/22/2018    592.0 Triglyceride is over 400; calculations on Lipids are invalid.   HDL 31.70 (L) 02/22/2018   LDLDIRECT 103.0 02/22/2018   LDLCALC 98 01/24/2016   ALT 13 02/22/2018   AST 19 02/22/2018   NA 136 02/22/2018   K 3.8 02/22/2018   CL 100 02/22/2018   CREATININE 1.04 02/22/2018   BUN 13 02/22/2018   CO2 29 02/22/2018   TSH 0.93 02/22/2018   HGBA1C 5.7 02/22/2018    US Abdomen Limited Ruq  Result Date: 10/29/2017 CLINICAL DATA:  Abdominal pain EXAM: ULTRASOUND ABDOMEN LIMITED RIGHT UPPER QUADRANT COMPARISON:  None. FINDINGS: Gallbladder: No gallstones or wall thickening visualized. No sonographic Murphy sign noted by sonographer. Common bile duct: Diameter: 2.8 mm. Liver: Slightly increased echogenicity without focal mass. This likely represents fatty infiltration. Portal vein is patent on color Doppler imaging with normal direction of blood flow towards the liver. IMPRESSION: Mild fatty infiltration of the liver. Electronically Signed   By: Alcide Clever M.D.   On: 10/29/2017 11:06    Assessment & Plan:   Problem List Items Addressed This Visit    Acute midline thoracic back pain    Secondary to trauma.  Lower spine films are negative for fractures.  Checking thoracic spine and unilateral ribs on the right.  Celebrex/tylenol/tramadol and ice for now no heavy lifting until fractures ruled out.        Other Visit Diagnoses    Upper back pain on right side    -  Primary    Relevant Orders   DG Ribs Unilateral Right   Urinalysis, Routine w reflex microscopic      I am having Markale T. Zelinski maintain his ALPRAZolam, escitalopram, pantoprazole, fluocinonide cream, metoprolol succinate, Fish Oil, celecoxib, and traMADol.  No orders of the defined types were placed in this encounter.   There are no discontinued medications.  Follow-up: No follow-ups on file.   Sherlene Shams, MD

## 2018-05-09 DIAGNOSIS — G4733 Obstructive sleep apnea (adult) (pediatric): Secondary | ICD-10-CM | POA: Diagnosis not present

## 2018-06-08 ENCOUNTER — Ambulatory Visit: Payer: 59 | Admitting: Internal Medicine

## 2018-06-08 DIAGNOSIS — G4733 Obstructive sleep apnea (adult) (pediatric): Secondary | ICD-10-CM | POA: Diagnosis not present

## 2018-06-24 ENCOUNTER — Other Ambulatory Visit: Payer: Self-pay | Admitting: *Deleted

## 2018-06-24 MED ORDER — FLUOCINONIDE 0.05 % EX CREA: 1.0000 "application " | TOPICAL_CREAM | Freq: Two times a day (BID) | CUTANEOUS | 2 refills | Status: DC

## 2018-06-24 NOTE — Progress Notes (Unsigned)
Medication refilled

## 2018-07-05 ENCOUNTER — Other Ambulatory Visit: Payer: Self-pay

## 2018-07-05 MED ORDER — ESCITALOPRAM OXALATE 20 MG PO TABS: 20.0000 mg | ORAL_TABLET | Freq: Every day | ORAL | 3 refills | Status: DC

## 2018-07-09 DIAGNOSIS — G4733 Obstructive sleep apnea (adult) (pediatric): Secondary | ICD-10-CM | POA: Diagnosis not present

## 2018-07-18 ENCOUNTER — Ambulatory Visit: Payer: 59 | Admitting: Internal Medicine

## 2018-07-21 ENCOUNTER — Telehealth: Payer: Self-pay | Admitting: Internal Medicine

## 2018-07-21 NOTE — Telephone Encounter (Signed)
Please advise 

## 2018-07-21 NOTE — Telephone Encounter (Signed)
Pt has an appt scheduled on 07/22/2018.

## 2018-07-21 NOTE — Telephone Encounter (Signed)
Copied from CRM [id number]. Topic: Quick Communication - See Telephone Encounter >> Jul 21, 2018  1:29 PM Jens SomMedley, Jennifer A wrote: CRM for notification. See Telephone encounter for: 07/21/18. Randel Piggazah [phone] is calling for Medical Record regarding the C-pap machine. The records were faxed 07/21/18 please advise.

## 2018-07-22 ENCOUNTER — Ambulatory Visit: Payer: 59 | Admitting: Internal Medicine

## 2018-07-22 ENCOUNTER — Encounter: Payer: Self-pay | Admitting: Internal Medicine

## 2018-07-22 VITALS — BP 114/78 | HR 84 | Temp 98.3°F | Resp 15 | Ht 72.0 in | Wt 336.0 lb

## 2018-07-22 DIAGNOSIS — G4733 Obstructive sleep apnea (adult) (pediatric): Secondary | ICD-10-CM | POA: Diagnosis not present

## 2018-07-22 DIAGNOSIS — M25542 Pain in joints of left hand: Secondary | ICD-10-CM | POA: Diagnosis not present

## 2018-07-22 DIAGNOSIS — M25541 Pain in joints of right hand: Secondary | ICD-10-CM

## 2018-07-22 DIAGNOSIS — R002 Palpitations: Secondary | ICD-10-CM | POA: Diagnosis not present

## 2018-07-22 NOTE — Progress Notes (Signed)
Subjective:  Patient ID: Mark Palmer, male    DOB: 1991/04/29  Age: 27 y.o. MRN: [id number]  CC: The primary encounter diagnosis was OSA (obstructive sleep apnea). Diagnoses of Joint pain in fingers of both hands, Morbid obesity (HCC), and Palpitations were also pertinent to this visit.  HPI Mark Palmer presents for follow up on obesity and OSA diagnosed July 31 with an overnight sleep study. He has been wearing CPAP every night ,since her received his mask. An dhis compliance reports  Suggest that he is wearing it 100%  of the times. He is sleeping well ith the mask and finds that he now Can't sleep without it.  Notes improved wakefulness during the day and less fatigue   Depression screen positive.  lexapro since 2013  No prior trial of wellbutrin.  Not interested in adding medication  Obeisty:  Weight gain addressed.  Diet examined and activity level  Discussed.   Joint pain:  Multiple joints painful involving both hands.   Outpatient Medications Prior to Visit  Medication Sig Dispense Refill  . ALPRAZolam (XANAX) 0.5 MG tablet Take 1 tablet (0.5 mg total) by mouth 3 (three) times daily as needed for anxiety. 30 tablet 0  . escitalopram (LEXAPRO) 20 MG tablet Take 1 tablet (20 mg total) by mouth daily. 90 tablet 3  . fluocinonide cream (LIDEX) 0.05 % Apply 1 application topically 2 (two) times daily. 30 g 2  . metoprolol succinate (TOPROL-XL) 25 MG 24 hr tablet Take 1 tablet (25 mg total) by mouth daily. 90 tablet 3  . Omega-3 Fatty Acids (FISH OIL) 1000 MG CAPS Take 3 capsules (3,000 mg total) by mouth daily. 90 capsule 11  . pantoprazole (PROTONIX) 40 MG tablet Take 1 tablet (40 mg total) by mouth daily. 90 tablet 3  . celecoxib (CELEBREX) 200 MG capsule Take 1 capsule (200 mg total) by mouth 2 (two) times daily. (Patient not taking: Reported on 07/22/2018) 60 capsule 1  . traMADol (ULTRAM) 50 MG tablet Take 1 tablet (50 mg total) by mouth every 8 (eight) hours as needed. (Patient  not taking: Reported on 07/22/2018) 30 tablet 0   No facility-administered medications prior to visit.     Review of Systems;  Patient denies headache, fevers, malaise, unintentional weight loss, skin rash, eye pain, sinus congestion and sinus pain, sore throat, dysphagia,  hemoptysis , cough, dyspnea, wheezing, chest pain, palpitations, orthopnea, edema, abdominal pain, nausea, melena, diarrhea, constipation, flank pain, dysuria, hematuria, urinary  Frequency, nocturia, numbness, tingling, seizures,  Focal weakness, Loss of consciousness,  Tremor, insomnia, depression, anxiety, and suicidal ideation.      Objective:  BP 114/78 (BP Location: Left Arm, Patient Position: Sitting, Cuff Size: Large)   Pulse 84   Temp 98.3 F (36.8 C) (Oral)   Resp 15   Ht 6' (1.829 m)   Wt (!) 336 lb (152.4 kg)   SpO2 97%   BMI 45.57 kg/m   BP Readings from Last 3 Encounters:  07/22/18 114/78  04/13/18 110/74  03/09/18 140/80    Wt Readings from Last 3 Encounters:  07/22/18 (!) 336 lb (152.4 kg)  04/13/18 (!) 325 lb (147.4 kg)  03/09/18 (!) 324 lb (147 kg)    General appearance: alert, cooperative and appears stated age Ears: normal TM's and external ear canals both ears Throat: lips, mucosa, and tongue normal; teeth and gums normal Neck: no adenopathy, no carotid bruit, supple, symmetrical, trachea midline and thyroid not enlarged, symmetric, no tenderness/mass/nodules Back:  symmetric, no curvature. ROM normal. No CVA tenderness. Lungs: clear to auscultation bilaterally Heart: regular rate and rhythm, S1, S2 normal, no murmur, click, rub or gallop Abdomen: soft, non-tender; bowel sounds normal; no masses,  no organomegaly Pulses: 2+ and symmetric Skin: Skin color, texture, turgor normal. No rashes or lesions Lymph nodes: Cervical, supraclavicular, and axillary nodes normal.  Lab Results  Component Value Date   HGBA1C 5.7 02/22/2018   HGBA1C 5.5 09/27/2017   HGBA1C 5.3 01/24/2016     Lab Results  Component Value Date   CREATININE 1.04 02/22/2018   CREATININE 1.09 07/08/2017   CREATININE 1.06 11/10/2016    Lab Results  Component Value Date   WBC 9.2 07/08/2017   HGB 15.0 07/08/2017   HCT 43.2 07/08/2017   PLT 235 07/08/2017   GLUCOSE 96 02/22/2018   CHOL 223 (H) 02/22/2018   TRIG (H) 02/22/2018    592.0 Triglyceride is over 400; calculations on Lipids are invalid.   HDL 31.70 (L) 02/22/2018   LDLDIRECT 103.0 02/22/2018   LDLCALC 98 01/24/2016   ALT 13 02/22/2018   AST 19 02/22/2018   NA 136 02/22/2018   K 3.8 02/22/2018   CL 100 02/22/2018   CREATININE 1.04 02/22/2018   BUN 13 02/22/2018   CO2 29 02/22/2018   TSH 0.93 02/22/2018   HGBA1C 5.7 02/22/2018    US Abdomen Limited Ruq  Result Date: 10/29/2017 CLINICAL DATA:  Abdominal pain EXAM: ULTRASOUND ABDOMEN LIMITED RIGHT UPPER QUADRANT COMPARISON:  None. FINDINGS: Gallbladder: No gallstones or wall thickening visualized. No sonographic Murphy sign noted by sonographer. Common bile duct: Diameter: 2.8 mm. Liver: Slightly increased echogenicity without focal mass. This likely represents fatty infiltration. Portal vein is patent on color Doppler imaging with normal direction of blood flow towards the liver. IMPRESSION: Mild fatty infiltration of the liver. Electronically Signed   By: Alcide Clever M.D.   On: 10/29/2017 11:06    Assessment & Plan:   Problem List Items Addressed This Visit    Joint pain in fingers of both hands    Plain films done due to personal history of psoriasis       Relevant Orders   Sedimentation rate   C-reactive protein   CBC with Differential/Platelet   ANA w/Reflex if Positive   Cyclic citrul peptide antibody, IgG   Uric acid   DG Hand Complete Right   Morbid obesity (HCC)    I have addressed  BMI and recommended a low glycemic index diet utilizing smaller more frequent meals to increase metabolism.  I have also recommended that patient start exercising with a goal  of 30 minutes of aerobic exercise a minimum of 5 days per week. Screening for lipid disorders, thyroid and diabetes to be done.        OSA (obstructive sleep apnea) - Primary    Diagnosed by sleep study. he is wearing her CPAP every night a minimum of 6 hours per night and notes improved daytime wakefulness and decreased fatigue       Palpitations    Resolved with medication and treatment of OSA        A total of 40 minutes was spent with patient more than half of which was spent in counseling patient on the above mentioned issues , reviewing and explaining recent labs and imaging studies done, and coordination of care.   I have discontinued Mark Palmer's celecoxib and traMADol. I am also having him maintain his ALPRAZolam, pantoprazole, metoprolol succinate, Fish Oil,  fluocinonide cream, and escitalopram.  No orders of the defined types were placed in this encounter.   Medications Discontinued During This Encounter  Medication Reason  . celecoxib (CELEBREX) 200 MG capsule   . traMADol (ULTRAM) 50 MG tablet Error    Follow-up: No follow-ups on file.   Sherlene Shamseresa L Malli Falotico, MD

## 2018-07-24 DIAGNOSIS — M25541 Pain in joints of right hand: Secondary | ICD-10-CM | POA: Insufficient documentation

## 2018-07-24 DIAGNOSIS — M25542 Pain in joints of left hand: Secondary | ICD-10-CM

## 2018-07-24 NOTE — Assessment & Plan Note (Signed)
Plain films done due to personal history of psoriasis

## 2018-07-24 NOTE — Assessment & Plan Note (Signed)
I have addressed  BMI and recommended a low glycemic index diet utilizing smaller more frequent meals to increase metabolism.  I have also recommended that patient start exercising with a goal of 30 minutes of aerobic exercise a minimum of 5 days per week. Screening for lipid disorders, thyroid and diabetes to be done   

## 2018-07-24 NOTE — Assessment & Plan Note (Signed)
Resolved with medication and treatment of OSA

## 2018-07-24 NOTE — Assessment & Plan Note (Signed)
Diagnosed by sleep study. he is wearing her CPAP every night a minimum of 6 hours per night and notes improved daytime wakefulness and decreased fatigue  

## 2018-07-25 ENCOUNTER — Other Ambulatory Visit (INDEPENDENT_AMBULATORY_CARE_PROVIDER_SITE_OTHER): Payer: 59

## 2018-07-25 DIAGNOSIS — M25542 Pain in joints of left hand: Secondary | ICD-10-CM

## 2018-07-25 DIAGNOSIS — M25541 Pain in joints of right hand: Secondary | ICD-10-CM

## 2018-07-25 LAB — CBC WITH DIFFERENTIAL/PLATELET
Basophils Absolute: 0.1 10*3/uL (ref 0.0–0.1)
Basophils Relative: 0.8 % (ref 0.0–3.0)
Eosinophils Absolute: 0.2 10*3/uL (ref 0.0–0.7)
Eosinophils Relative: 3 % (ref 0.0–5.0)
HCT: 42.3 % (ref 39.0–52.0)
HEMOGLOBIN: 14.5 g/dL (ref 13.0–17.0)
Lymphocytes Relative: 30.3 % (ref 12.0–46.0)
Lymphs Abs: 2.3 10*3/uL (ref 0.7–4.0)
MCHC: 34.3 g/dL (ref 30.0–36.0)
MCV: 84.5 fl (ref 78.0–100.0)
Monocytes Absolute: 0.7 10*3/uL (ref 0.1–1.0)
Monocytes Relative: 8.8 % (ref 3.0–12.0)
Neutro Abs: 4.3 10*3/uL (ref 1.4–7.7)
Neutrophils Relative %: 57.1 % (ref 43.0–77.0)
Platelets: 253 10*3/uL (ref 150.0–400.0)
RBC: 5 Mil/uL (ref 4.22–5.81)
RDW: 12.7 % (ref 11.5–15.5)
WBC: 7.5 10*3/uL (ref 4.0–10.5)

## 2018-07-25 LAB — URIC ACID: Uric Acid, Serum: 7.1 mg/dL (ref 4.0–7.8)

## 2018-07-25 LAB — C-REACTIVE PROTEIN: CRP: 0.4 mg/dL — AB (ref 0.5–20.0)

## 2018-07-25 LAB — SEDIMENTATION RATE: Sed Rate: 17 mm/hr — ABNORMAL HIGH (ref 0–15)

## 2018-07-25 NOTE — Addendum Note (Signed)
Addended by: Warden FillersWRIGHT, LATOYA S on: 07/25/2018 12:16 PM   Modules accepted: Orders

## 2018-07-26 LAB — ANA: Anti Nuclear Antibody(ANA): NEGATIVE

## 2018-07-26 LAB — CYCLIC CITRUL PEPTIDE ANTIBODY, IGG: Cyclic Citrullin Peptide Ab: <16 UNITS

## 2018-07-28 ENCOUNTER — Ambulatory Visit (INDEPENDENT_AMBULATORY_CARE_PROVIDER_SITE_OTHER): Payer: 59

## 2018-07-28 DIAGNOSIS — M25541 Pain in joints of right hand: Secondary | ICD-10-CM | POA: Diagnosis not present

## 2018-07-28 DIAGNOSIS — M25542 Pain in joints of left hand: Secondary | ICD-10-CM | POA: Diagnosis not present

## 2018-07-28 DIAGNOSIS — M79641 Pain in right hand: Secondary | ICD-10-CM | POA: Diagnosis not present

## 2018-08-08 DIAGNOSIS — G4733 Obstructive sleep apnea (adult) (pediatric): Secondary | ICD-10-CM | POA: Diagnosis not present

## 2018-08-24 ENCOUNTER — Other Ambulatory Visit: Payer: Self-pay

## 2018-08-24 MED ORDER — FLUOCINONIDE 0.05 % EX CREA: 1.0000 "application " | TOPICAL_CREAM | Freq: Two times a day (BID) | CUTANEOUS | 2 refills | Status: DC

## 2018-08-24 MED ORDER — PANTOPRAZOLE SODIUM 40 MG PO TBEC: 40.0000 mg | DELAYED_RELEASE_TABLET | Freq: Every day | ORAL | 3 refills | Status: DC

## 2018-09-08 DIAGNOSIS — G4733 Obstructive sleep apnea (adult) (pediatric): Secondary | ICD-10-CM | POA: Diagnosis not present

## 2018-10-06 DIAGNOSIS — G4733 Obstructive sleep apnea (adult) (pediatric): Secondary | ICD-10-CM | POA: Diagnosis not present

## 2018-10-08 DIAGNOSIS — G4733 Obstructive sleep apnea (adult) (pediatric): Secondary | ICD-10-CM | POA: Diagnosis not present

## 2018-10-10 NOTE — Progress Notes (Signed)
Follow-up Outpatient Visit Date: 10/12/2018  Primary Care Provider: Sherlene Shams, MD [address] [place] [address]  Chief Complaint: Follow-up palpitations and hyperlipidemia  HPI:  Mark Palmer is a 28 y.o. year-old male with history of anxiety, GERD, psoriasis, and allergies, who presents for follow-up of palpitations.  I last saw him in 02/2018, at which time he reported more frequent palpitations.  He had switched from propranolol to metoprolol succinate under the direction of Dr. Darrick Huntsman a few days before our visit and had not had any further palpitations.  However, he was concerned about increased fatigue since switching to metoprolol.  I encouraged him to continue taking metoprolol; if his palpitations returned, I recommended that he try increasing this to 50 mg daily.  We also added fish oil 3,000 mg daily for management of his marked hypertriglyceridemia.  Today, Mark Palmer reports feeling very well.  Shortly after our last visit, he began using CPAP and has noticed better sleep as well as marked improvement in his palpitations.  He has also been taking metoprolol in the evenings and no longer has significant daytime fatigue.  He has not been taking fish oil over the last month but plans to restart it soon.  He has not had any chest pain, lightheadedness, orthopnea, PND, and edema.  He has stable exertional dyspnea, which he attributes to his obesity.  This has been stable.  He is hoping to being a regular diet and exercise regimen in the near future.  --------------------------------------------------------------------------------------------------  Cardiovascular History & Procedures: Cardiovascular Problems:  Palpitations and PVC's  Hypertriglyceridemia  Risk Factors:  Obesity  Cath/PCI:  None  CV Surgery:  None  EP Procedures and Devices:  48-hour Holter monitor (03/24/16): Normal sinus rhythm with rare PVCs and PACs. No significant  arrhythmias.  Non-Invasive Evaluation(s):  TTE (03/24/16): Normal LV size and function with LVEF 60-65% and normal wall motion. Normal RV size and function. No significant valvular abnormalities.  Recent CV Pertinent Labs: Lab Results  Component Value Date   CHOL 223 (H) 02/22/2018   HDL 31.70 (L) 02/22/2018   LDLCALC 98 01/24/2016   LDLDIRECT 103.0 02/22/2018   TRIG (H) 02/22/2018    592.0 Triglyceride is over 400; calculations on Lipids are invalid.   CHOLHDL 7 02/22/2018   K 3.8 02/22/2018   MG 2.1 02/22/2018   BUN 13 02/22/2018   CREATININE 1.04 02/22/2018   CREATININE 1.07 01/24/2016    Past medical and surgical history were reviewed and updated in EPIC.  Current Meds  Medication Sig  . ALPRAZolam (XANAX) 0.5 MG tablet Take 1 tablet (0.5 mg total) by mouth 3 (three) times daily as needed for anxiety.  Marland Kitchen escitalopram (LEXAPRO) 20 MG tablet Take 1 tablet (20 mg total) by mouth daily.  . fluocinonide cream (LIDEX) 0.05 % Apply 1 application topically 2 (two) times daily.  . metoprolol succinate (TOPROL-XL) 25 MG 24 hr tablet Take 1 tablet (25 mg total) by mouth daily.  . Omega-3 Fatty Acids (FISH OIL) 1000 MG CAPS Take 3 capsules (3,000 mg total) by mouth daily.  . pantoprazole (PROTONIX) 40 MG tablet Take 1 tablet (40 mg total) by mouth daily.    Allergies: Clindamycin/lincomycin and Amoxicillin  Social History   Tobacco Use  . Smoking status: Never Smoker  . Smokeless tobacco: Current User    Types: Snuff  Substance Use Topics  . Alcohol use: Yes    Alcohol/week: 0.0 - 1.0 standard drinks    Comment: 1  drink every few months  . Drug use: No    Family History  Problem Relation Age of Onset  . Prostate cancer Maternal Grandfather   . Stroke Maternal Grandfather   . Diabetes Maternal Grandfather   . Heart attack Maternal Grandfather   . Hypertension Mother   . Mental illness Mother        anxiety  . Hyperlipidemia Mother   . Hypertension Maternal Uncle    . Heart disease Maternal Uncle   . Mental illness Maternal Aunt        anxiety  . Mental illness Maternal Aunt        anxiety  . Mental illness Maternal Uncle        anxiety  . Diabetes Maternal Uncle   . Psoriasis Father   . ADD / ADHD Sister   . Sudden Cardiac Death Neg Hx     Review of Systems: A 12-system review of systems was performed and was negative except as noted in the HPI.  --------------------------------------------------------------------------------------------------  Physical Exam: BP 136/90 (BP Location: Left Arm, Patient Position: Sitting, Cuff Size: Large)   Pulse 94   Ht 6' (1.829 m)   Wt (!) 343 lb 12 oz (155.9 kg)   SpO2 98%   BMI 46.62 kg/m   General:  NAD. HEENT: No conjunctival pallor or scleral icterus. Moist mucous membranes.  OP clear. Neck: Supple without lymphadenopathy, thyromegaly, JVD, or HJR, though evaluation is limited by body habitus. Lungs: Normal work of breathing. Clear to auscultation bilaterally without wheezes or crackles. Heart: Regular rate and rhythm without murmurs, rubs, or gallops. Unable to assess PMI due to body habitus. Abd: Bowel sounds present. Soft, NT/ND.  Unable to assess HSM due to body habitus. Ext: No lower extremity edema. Radial, PT, and DP pulses are 2+ bilaterally. Skin: Warm and dry without rash.  EKG:  NSR without abnormality.  Lab Results  Component Value Date   WBC 7.5 07/25/2018   HGB 14.5 07/25/2018   HCT 42.3 07/25/2018   MCV 84.5 07/25/2018   PLT 253.0 07/25/2018    Lab Results  Component Value Date   NA 136 02/22/2018   K 3.8 02/22/2018   CL 100 02/22/2018   CO2 29 02/22/2018   BUN 13 02/22/2018   CREATININE 1.04 02/22/2018   GLUCOSE 96 02/22/2018   ALT 13 02/22/2018    Lab Results  Component Value Date   CHOL 223 (H) 02/22/2018   HDL 31.70 (L) 02/22/2018   LDLCALC 98 01/24/2016   LDLDIRECT 103.0 02/22/2018   TRIG (H) 02/22/2018    592.0 Triglyceride is over 400; calculations  on Lipids are invalid.   CHOLHDL 7 02/22/2018    --------------------------------------------------------------------------------------------------  ASSESSMENT AND PLAN: Palpitations Essentially resolved with metoprolol and CPAP use.  I have encouraged compliance with both.  No further w/u at his time.  Hypertriglyceridemia No follow-up labs since 02/2018.  However, Mark Palmer has not bene taking fish oil for at least 1 month.  I have recommended that he restart fish oil, with repeat fasting lipid panel in a few months.  He would like to have this checked through Dr. Darrick Huntsman.  I also encouraged weight loss through diet and exercise, which will hopefully help improve his triglycerides as well.  Morbid obesity Mark Palmer is eager to lose weight through diet and exercise, which I encouraged.  Follow-up: Return to clinic in 1 year.  Yvonne Kendall, MD 10/12/2018 10:24 PM

## 2018-10-12 ENCOUNTER — Ambulatory Visit: Payer: 59 | Admitting: Internal Medicine

## 2018-10-12 ENCOUNTER — Encounter: Payer: Self-pay | Admitting: Internal Medicine

## 2018-10-12 VITALS — BP 136/90 | HR 94 | Ht 72.0 in | Wt 343.8 lb

## 2018-10-12 DIAGNOSIS — R002 Palpitations: Secondary | ICD-10-CM | POA: Diagnosis not present

## 2018-10-12 DIAGNOSIS — E781 Pure hyperglyceridemia: Secondary | ICD-10-CM

## 2018-10-12 NOTE — Patient Instructions (Signed)
Medication Instructions:  Your physician recommends that you continue on your current medications as directed. Please refer to the Current Medication list given to you today.  If you need a refill on your cardiac medications before your next appointment, please call your pharmacy.   Lab work: none If you have labs (blood work) drawn today and your tests are completely normal, you will receive your results only by: . MyChart Message (if you have MyChart) OR . A paper copy in the mail If you have any lab test that is abnormal or we need to change your treatment, we will call you to review the results.  Testing/Procedures: none  Follow-Up: At [facility], you and your health needs are our priority.  As part of our continuing mission to provide you with exceptional heart care, we have created designated Provider Care Teams.  These Care Teams include your primary Cardiologist (physician) and Advanced Practice Providers (APPs -  Physician Assistants and Nurse Practitioners) who all work together to provide you with the care you need, when you need it. You will need a follow up appointment in 12 months.  Please call our office 2 months in advance to schedule this appointment.  You may see DR CHRISTOPHER END or one of the following Advanced Practice Providers on your designated Care Team:   Christopher Berge, NP Ryan Dunn, PA-C . Jacquelyn Visser, PA-C    

## 2018-10-21 ENCOUNTER — Telehealth: Payer: Self-pay

## 2018-10-21 MED ORDER — OSELTAMIVIR PHOSPHATE 75 MG PO CAPS: 75.0000 mg | ORAL_CAPSULE | Freq: Two times a day (BID) | ORAL | 0 refills | Status: DC

## 2018-10-21 NOTE — Addendum Note (Signed)
Addended by: Sherlene Shams on: 10/21/2018 09:41 AM   Modules accepted: Orders

## 2018-10-21 NOTE — Telephone Encounter (Signed)
Pt was diagnosed with flu A. Symptoms started late last night. Pt has a cough, no fever. Would like tamiflu sent to [facility].

## 2018-11-04 ENCOUNTER — Other Ambulatory Visit: Payer: Self-pay | Admitting: Internal Medicine

## 2018-11-04 MED ORDER — CHLORHEXIDINE GLUCONATE 0.12 % MT SOLN: 15.0000 mL | Freq: Two times a day (BID) | OROMUCOSAL | 0 refills | Status: DC

## 2018-11-04 MED ORDER — DOXYCYCLINE HYCLATE 100 MG PO TABS: 100.0000 mg | ORAL_TABLET | Freq: Two times a day (BID) | ORAL | 0 refills | Status: DC

## 2018-11-04 MED ORDER — LEVOFLOXACIN 500 MG PO TABS: 500.0000 mg | ORAL_TABLET | Freq: Every day | ORAL | 0 refills | Status: DC

## 2018-11-07 DIAGNOSIS — G4733 Obstructive sleep apnea (adult) (pediatric): Secondary | ICD-10-CM | POA: Diagnosis not present

## 2018-11-23 ENCOUNTER — Other Ambulatory Visit: Payer: Self-pay | Admitting: Family Medicine

## 2018-11-23 ENCOUNTER — Other Ambulatory Visit: Payer: Self-pay | Admitting: Internal Medicine

## 2018-11-23 MED ORDER — ALPRAZOLAM 0.5 MG PO TABS: 0.5000 mg | ORAL_TABLET | Freq: Every evening | ORAL | 1 refills | Status: DC | PRN

## 2018-11-23 NOTE — Progress Notes (Signed)
Alprazolam refilled and sent to [facility]

## 2018-11-23 NOTE — Telephone Encounter (Signed)
Refilled: 01/05/2017 Last OV: 07/22/2018 Next OV: not scheduled

## 2018-12-08 DIAGNOSIS — G4733 Obstructive sleep apnea (adult) (pediatric): Secondary | ICD-10-CM | POA: Diagnosis not present

## 2019-01-07 DIAGNOSIS — G4733 Obstructive sleep apnea (adult) (pediatric): Secondary | ICD-10-CM | POA: Diagnosis not present

## 2019-01-19 DIAGNOSIS — R002 Palpitations: Secondary | ICD-10-CM

## 2019-01-24 ENCOUNTER — Other Ambulatory Visit: Payer: Self-pay

## 2019-01-24 ENCOUNTER — Other Ambulatory Visit (INDEPENDENT_AMBULATORY_CARE_PROVIDER_SITE_OTHER): Payer: 59

## 2019-01-24 DIAGNOSIS — R002 Palpitations: Secondary | ICD-10-CM

## 2019-01-24 LAB — BASIC METABOLIC PANEL
BUN: 11 mg/dL (ref 6–23)
CO2: 27 mEq/L (ref 19–32)
Calcium: 9 mg/dL (ref 8.4–10.5)
Chloride: 102 mEq/L (ref 96–112)
Creatinine, Ser: 0.93 mg/dL (ref 0.40–1.50)
GFR: 96.29 mL/min (ref >60.00)
Glucose, Bld: 80 mg/dL (ref 70–99)
Potassium: 4 mEq/L (ref 3.5–5.1)
Sodium: 137 mEq/L (ref 135–145)

## 2019-01-24 LAB — MAGNESIUM: Magnesium: 1.9 mg/dL (ref 1.5–2.5)

## 2019-02-20 ENCOUNTER — Other Ambulatory Visit: Payer: Self-pay | Admitting: Internal Medicine

## 2019-03-03 ENCOUNTER — Other Ambulatory Visit: Payer: Self-pay | Admitting: Internal Medicine

## 2019-05-11 ENCOUNTER — Other Ambulatory Visit (INDEPENDENT_AMBULATORY_CARE_PROVIDER_SITE_OTHER): Payer: 59

## 2019-05-11 ENCOUNTER — Other Ambulatory Visit: Payer: Self-pay | Admitting: Internal Medicine

## 2019-05-11 ENCOUNTER — Telehealth: Payer: Self-pay | Admitting: Internal Medicine

## 2019-05-11 ENCOUNTER — Other Ambulatory Visit: Payer: Self-pay

## 2019-05-11 DIAGNOSIS — R079 Chest pain, unspecified: Secondary | ICD-10-CM

## 2019-05-11 NOTE — Telephone Encounter (Signed)
Was called with stat lab result.  pts troponin reported as 0.01.  Called and discussed with Mark Palmer.  He has been having chest pain (described as sharp stabbing pain) for the last 5 days.  Did a lot of physical activity this weekend.  No change with pain or symptoms with increased activity or exertion.  No sob.  No pain with deep breathing.  No acid reflux.  Has a history of GERD.  On protonix.  No nausea or vomiting.  Had EKG today.  Reviewed by MD in office.  Reviewed by me while on phone with pt.  EKG - SR with no acute ischemic changes.  Was questioning if stress/anxiety contributing.  Discussed with pt regarding ER evaluation.  He did call cardiology today.  Per note, they are going to try to work him in for evaluation.  Will monitor symptoms.  Will take xanax tonight.  If any change or worsening symptoms, he will go to ER.

## 2019-05-11 NOTE — Telephone Encounter (Signed)
Thanks for letting me know!

## 2019-05-11 NOTE — Telephone Encounter (Signed)
Troponin less than 0.01. Called results to on call provider,  Dr.Scott. Left VM for patient including results and that Dr.Scott would be in touch soon. If he was having any difficulty now to call back immediately.

## 2019-05-12 ENCOUNTER — Ambulatory Visit (INDEPENDENT_AMBULATORY_CARE_PROVIDER_SITE_OTHER): Payer: 59 | Admitting: Cardiology

## 2019-05-12 ENCOUNTER — Telehealth: Payer: Self-pay | Admitting: Internal Medicine

## 2019-05-12 ENCOUNTER — Encounter: Payer: Self-pay | Admitting: Cardiology

## 2019-05-12 VITALS — BP 118/90 | HR 71 | Temp 97.0°F | Ht 72.0 in | Wt 364.8 lb

## 2019-05-12 DIAGNOSIS — R079 Chest pain, unspecified: Secondary | ICD-10-CM

## 2019-05-12 LAB — TROPONIN I: Troponin I: <0.01 ng/mL (ref 0.00–0.04)

## 2019-05-12 NOTE — Telephone Encounter (Signed)
05/11/2019   C/o left sided chest pain x 1-2 days w/o radiation h/o PVCs f/u with cards Also thought could be gas taking tums and otc gas x medication w/o relief   A/P  CP r/o cardiac etiology vs anxiety vs GERD  CC Dr. Saunders Revel  EKG NSR, stat trop negative  Also f/u PCP   [place]

## 2019-05-12 NOTE — Telephone Encounter (Signed)
lmov to schedule with DOD .

## 2019-05-12 NOTE — Telephone Encounter (Signed)
-----   Message from Nelva Bush, MD sent at 05/11/2019  4:07 PM EDT -----  ----- Message ----- From: McLean-Scocuzza, Nino Glow, MD Sent: 05/11/2019   4:04 PM EDT To: Nelva Bush, MD  Left sided CP today ordered EKG and Troponin  EKG appears normal  H/o PVCs  Can you schedule appt asap   Thanks Goree

## 2019-05-12 NOTE — Patient Instructions (Signed)
Medication Instructions:  Your physician recommends that you continue on your current medications as directed. Please refer to the Current Medication list given to you today.  If you need a refill on your cardiac medications before your next appointment, please call your pharmacy.   Lab work: None ordered  If you have labs (blood work) drawn today and your tests are completely normal, you will receive your results only by: . MyChart Message (if you have MyChart) OR . A paper copy in the mail If you have any lab test that is abnormal or we need to change your treatment, we will call you to review the results.  Testing/Procedures: None ordered   Follow-Up: At [facility], you and your health needs are our priority.  As part of our continuing mission to provide you with exceptional heart care, we have created designated Provider Care Teams.  These Care Teams include your primary Cardiologist (physician) and Advanced Practice Providers (APPs -  Physician Assistants and Nurse Practitioners) who all work together to provide you with the care you need, when you need it. You will need a follow up appointment as needed.  

## 2019-05-12 NOTE — Progress Notes (Signed)
Cardiology Office Note:    Date:  05/12/2019   ID:  Mark BettersBrock T Stetzer, DOB [birth year], MRN [id number]  PCP:  Sherlene Shamsullo, Teresa L, MD  Cardiologist:  Yvonne Kendallhristopher End, MD  Electrophysiologist:  None   Referring MD: Sherlene Shamsullo, Teresa L, MD   Chief Complaint  Patient presents with  . office visit    Sharp chest pain x 1 week; Denies SOB and edema    History of Present Illness:    Mark Palmer is a 28 y.o. male with a hx of hyperlipidemia, palpitations, anxiety who presents to the office due to sharp chest pain for about a week now.  Patient states pain is on the left side of his chest and lasts a couple of seconds.  He describes the pain as someone stabbing his chest very quickly.  Pain is not related with exertion.  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  He denies orthopnea, edema, palpitations, shortness of breath.  Past Medical History:  Diagnosis Date  . Anxiety   . Depression   . Environmental allergies   . GERD (gastroesophageal reflux disease)   . Psoriasis   . PVC's (premature ventricular contractions)     Past Surgical History:  Procedure Laterality Date  . SHOULDER ARTHROSCOPY WITH LABRAL REPAIR Right 2012  . WISDOM TOOTH EXTRACTION      Current Medications: Current Meds  Medication Sig  . ALPRAZolam (XANAX) 0.5 MG tablet Take 1 tablet (0.5 mg total) by mouth at bedtime as needed for anxiety.  Marland Kitchen. escitalopram (LEXAPRO) 20 MG tablet Take 1 tablet (20 mg total) by mouth daily.  . fluocinonide cream (LIDEX) 0.05 % APPLY TO THE AFFECTED AREA(S) 2 TIMES DAILY.  . metoprolol succinate (TOPROL-XL) 25 MG 24 hr tablet TAKE 1 TABLET (25 MG TOTAL) BY MOUTH DAILY.  . pantoprazole (PROTONIX) 40 MG tablet Take 1 tablet (40 mg total) by mouth daily.     Allergies:   Clindamycin/lincomycin and Amoxicillin   Social History   Socioeconomic History  . Marital status: Single    Spouse name: Not on file  . Number of children: Not on  file  . Years of education: Not on file  . Highest education level: Not on file  Occupational History  . Not on file  Social Needs  . Financial resource strain: Not on file  . Food insecurity    Worry: Not on file    Inability: Not on file  . Transportation needs    Medical: Not on file    Non-medical: Not on file  Tobacco Use  . Smoking status: Never Smoker  . Smokeless tobacco: Current User    Types: Snuff  Substance and Sexual Activity  . Alcohol use: Yes    Alcohol/week: 0.0 - 1.0 standard drinks    Comment: 1 drink every few months  . Drug use: No  . Sexual activity: Yes    Partners: Female  Lifestyle  . Physical activity    Days per week: Not on file    Minutes per session: Not on file  . Stress: Not on file  Relationships  . Social Musicianconnections    Talks on phone: Not on file    Gets together: Not on file    Attends religious service: Not on file    Active member of club or organization: Not on file    Attends meetings of clubs or organizations: Not on file    Relationship status:  Not on file  Other Topics Concern  . Not on file  Social History Narrative  . Not on file     Family History: The patient's family history includes ADD / ADHD in his sister; Diabetes in his maternal grandfather and maternal uncle; Heart attack in his maternal grandfather; Heart disease in his maternal uncle; Hyperlipidemia in his mother; Hypertension in his maternal uncle and mother; Mental illness in his maternal aunt, maternal aunt, maternal uncle, and mother; Prostate cancer in his maternal grandfather; Psoriasis in his father; Stroke in his maternal grandfather. There is no history of Sudden Cardiac Death.  ROS:   Please see the history of present illness.     All other systems reviewed and are negative.  EKGs/Labs/Other Studies Reviewed:    The following studies were reviewed today: Echo 03/24/2016 Study Conclusions  - Left ventricle: The cavity size was normal. Systolic  function was   normal. The estimated ejection fraction was in the range of 60%   to 65%. Wall motion was normal; there were no regional wall   motion abnormalities. Left ventricular diastolic function   parameters were normal. - Left atrium: The atrium was at the upper limits of normal in   size. - Right ventricle: Systolic function was normal. - Pulmonary arteries: Systolic pressure was within the normal   range.  Impressions:  - Normal study.  EKG:  EKG is  ordered today.  The ekg ordered today demonstrates sinus rhythm, normal ECG.  Recent Labs: 07/25/2018: Hemoglobin 14.5; Platelets 253.0 01/24/2019: BUN 11; Creatinine, Ser 0.93; Magnesium 1.9; Potassium 4.0; Sodium 137  Recent Lipid Panel    Component Value Date/Time   CHOL 223 (H) 02/22/2018 1536   TRIG (H) 02/22/2018 1536    592.0 Triglyceride is over 400; calculations on Lipids are invalid.   HDL 31.70 (L) 02/22/2018 1536   CHOLHDL 7 02/22/2018 1536   VLDL 36 (H) 01/24/2016 1651   LDLCALC 98 01/24/2016 1651   LDLDIRECT 103.0 02/22/2018 1536    Physical Exam:    VS:  BP 118/90 (BP Location: Left Arm, Patient Position: Sitting, Cuff Size: Large)   Pulse 71   Temp (!) 97 F (36.1 C)   Ht 6' (1.829 m)   Wt (!) 364 lb 12 oz (165.4 kg)   SpO2 97%   BMI 49.47 kg/m     Wt Readings from Last 3 Encounters:  05/12/19 (!) 364 lb 12 oz (165.4 kg)  10/12/18 (!) 343 lb 12 oz (155.9 kg)  07/22/18 (!) 336 lb (152.4 kg)     GEN:  Well nourished, well developed in no acute distress obese HEENT: Normal NECK: No JVD; No carotid bruits LYMPHATICS: No lymphadenopathy CARDIAC: RRR, no murmurs, rubs, gallops RESPIRATORY:  Clear to auscultation without rales, wheezing or rhonchi  ABDOMEN: Soft, non-tender, non-distended MUSCULOSKELETAL:  No edema; No deformity  SKIN: Warm and dry NEUROLOGIC:  Alert and oriented x 3 PSYCHIATRIC:  Normal affect   ASSESSMENT:   Symptoms of chest pain are very atypical.  Patient is young  and able to do heavy intensity work without reproducing pain.  His last echocardiogram showed normal ejection fraction, no wall motion abnormalities.  I will place him as a low risk for CAD given his age and all the factors stated above. 1. Chest pain, unspecified type   2. Morbid obesity ([place])    PLAN:    In order of problems listed above:  1. Atypical chest pain.  Reassured patient that his pain is  not likely of cardiac origin.   if pain becomes persistent, we may consider stress testing. 2. Weight loss advised.  Follow-up PRN. Vernona Rieger as needed. Medication Adjustments/Labs and Tests Ordered: Current medicines are reviewed at length with the patient today.  Concerns regarding medicines are outlined above.  Orders Placed This Encounter  Procedures  . EKG 12-Lead   No orders of the defined types were placed in this encounter.   Patient Instructions  Medication Instructions:  Your physician recommends that you continue on your current medications as directed. Please refer to the Current Medication list given to you today.  If you need a refill on your cardiac medications before your next appointment, please call your pharmacy.   Lab work: None ordered If you have labs (blood work) drawn today and your tests are completely normal, you will receive your results only by: Marland Kitchen MyChart Message (if you have MyChart) OR . A paper copy in the mail If you have any lab test that is abnormal or we need to change your treatment, we will call you to review the results.  Testing/Procedures: None ordered   Follow-Up: At [facility], you and your health needs are our priority.  As part of our continuing mission to provide you with exceptional heart care, we have created designated Provider Care Teams.  These Care Teams include your primary Cardiologist (physician) and Advanced Practice Providers (APPs -  Physician Assistants and Nurse Practitioners) who all work together to provide you with the  care you need, when you need it. You will need a follow up appointment as needed.      Signed, Debbe Odea, MD  05/12/2019 12:45 PM    [facility]

## 2019-05-12 NOTE — Telephone Encounter (Signed)
Scheduled 10/2

## 2019-05-16 NOTE — Telephone Encounter (Signed)
Patient was seen on 05/12/2019 by Dr Garen Lah.

## 2019-05-28 IMAGING — DX DG FOOT COMPLETE 3+V*L*
3 series · 3 of 3 positions shown · non-contrast
Comparison: None.

CLINICAL DATA: Left heel pain for 3 months.

EXAM:
LEFT FOOT - COMPLETE 3+ VIEW

[foot ap]
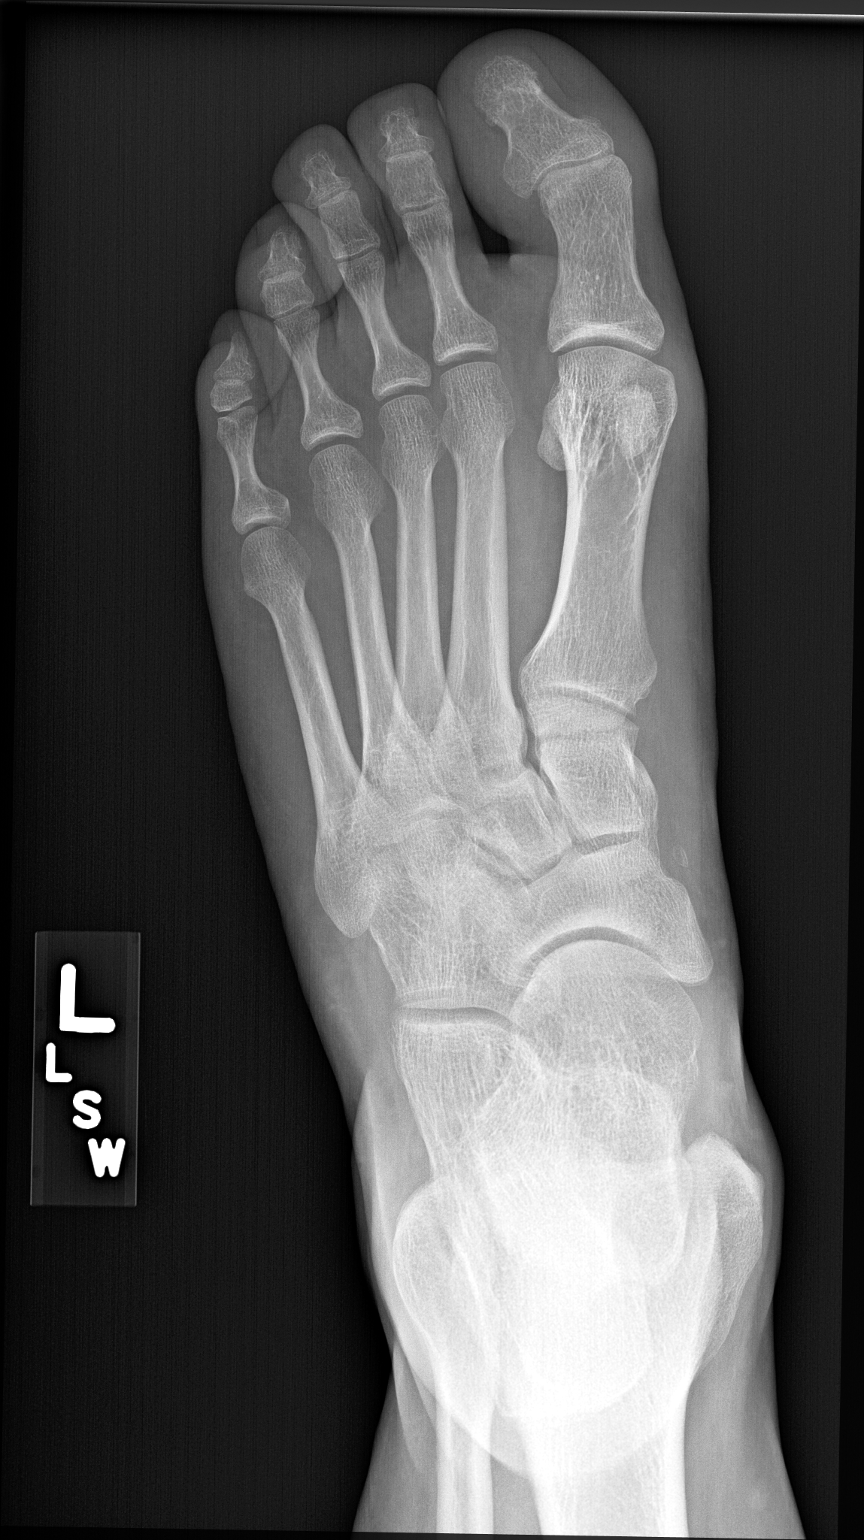

[foot obl (oblique)]
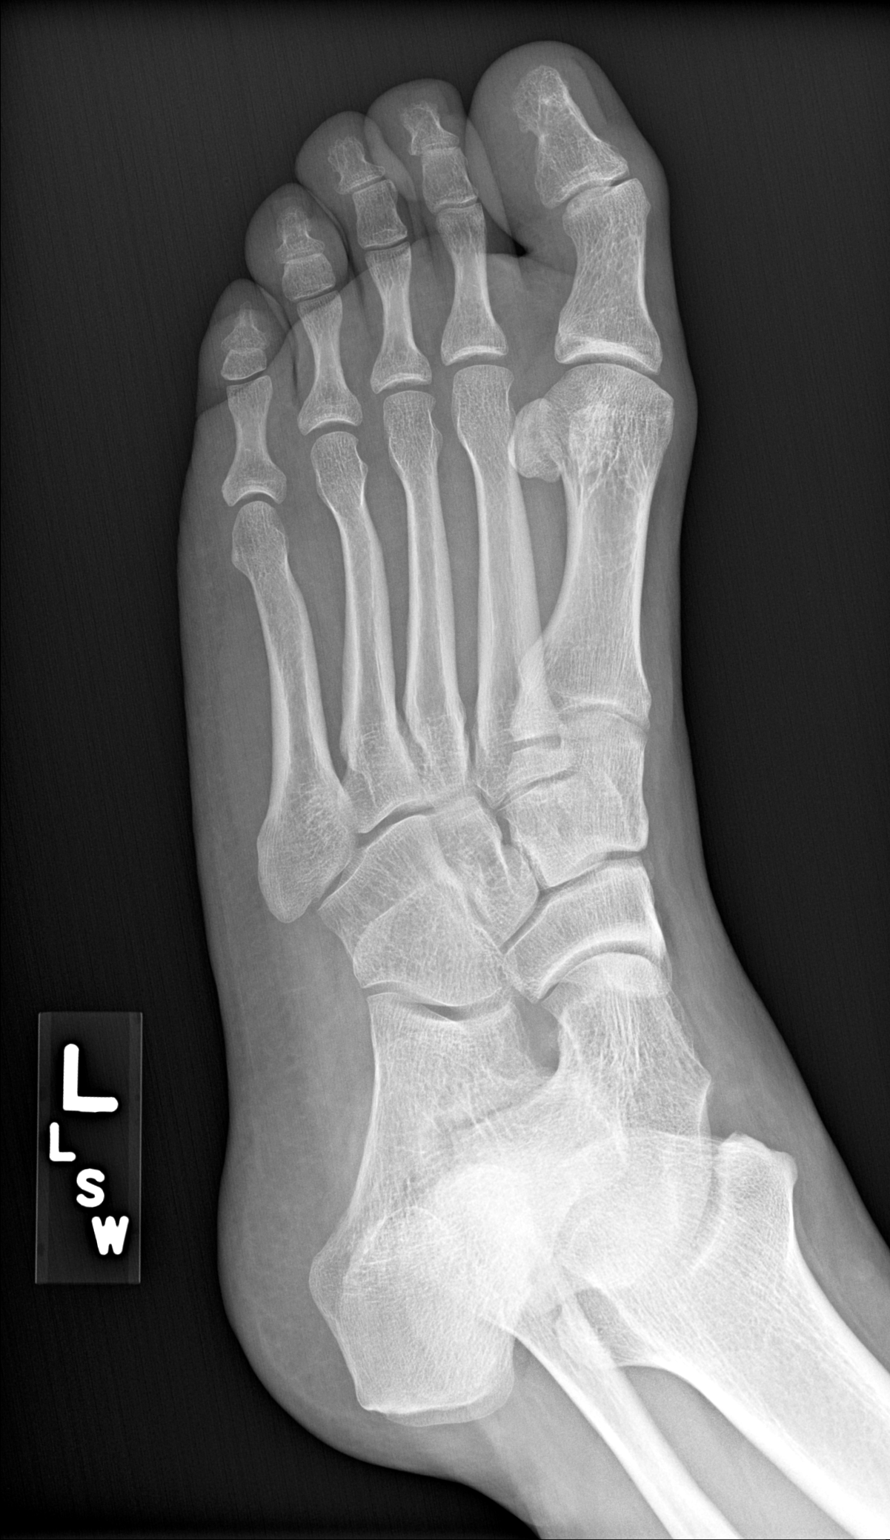

[foot lat]
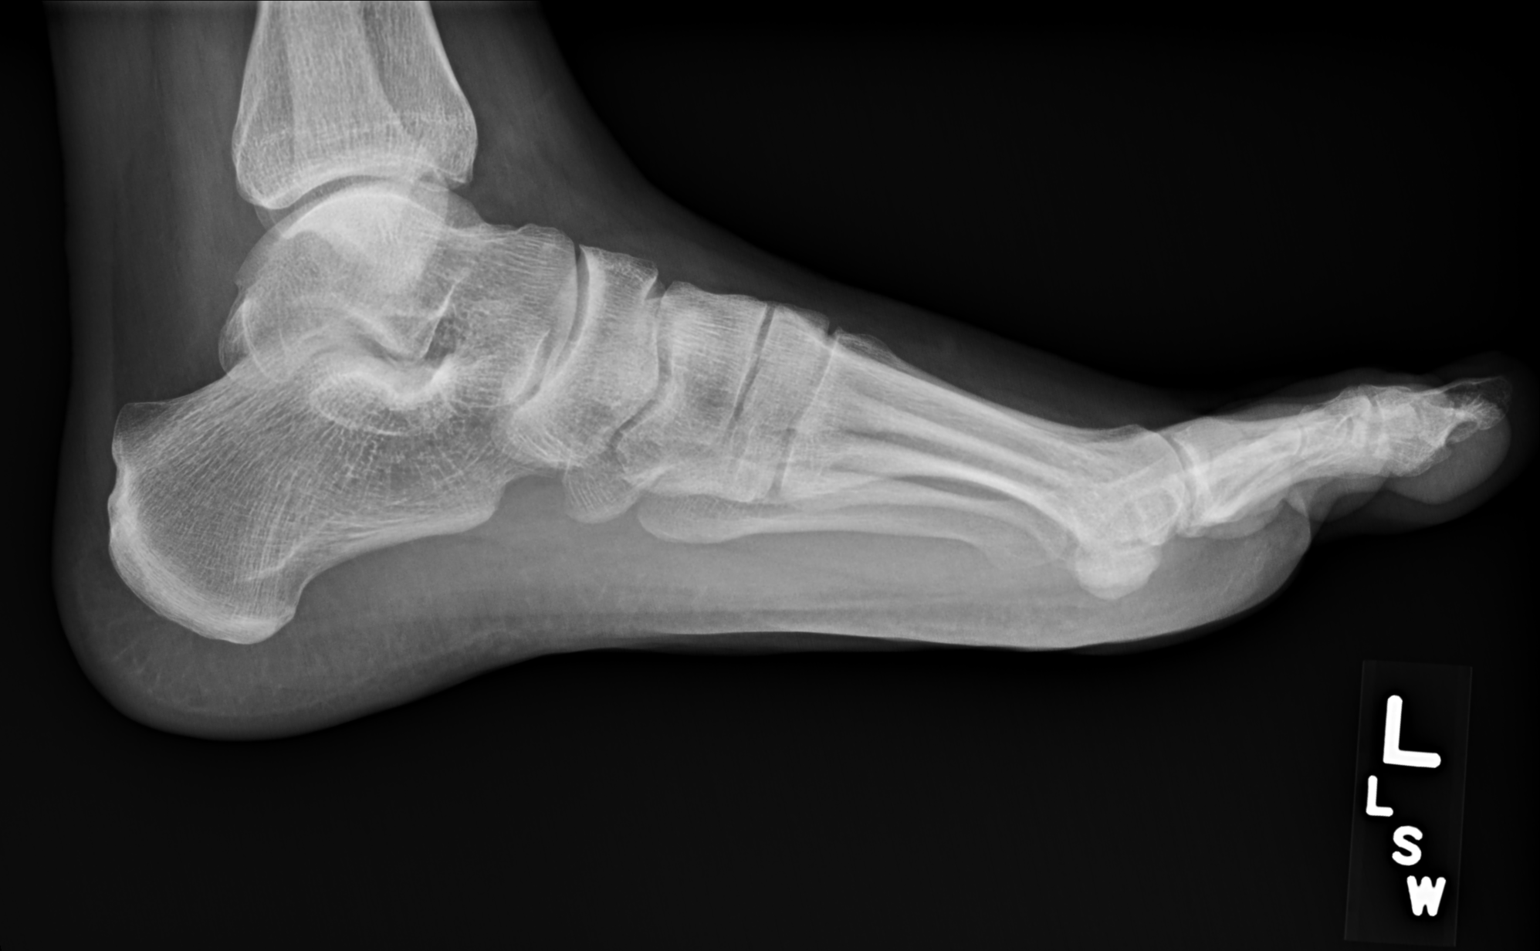

[3 of 3 positions shown; findings below may reference images not displayed]

FINDINGS: There are minimal arthritic changes of the IP joint of the great
toe. No other significant abnormality. Specifically, the calcaneus
appears normal.
IMPRESSION: No significant abnormalities.

## 2019-06-01 ENCOUNTER — Other Ambulatory Visit: Payer: Self-pay | Admitting: Internal Medicine

## 2019-06-23 ENCOUNTER — Other Ambulatory Visit: Payer: Self-pay | Admitting: Internal Medicine

## 2019-06-28 DIAGNOSIS — Z3141 Encounter for fertility testing: Secondary | ICD-10-CM | POA: Diagnosis not present

## 2019-08-21 ENCOUNTER — Other Ambulatory Visit: Payer: Self-pay | Admitting: Internal Medicine

## 2019-11-13 ENCOUNTER — Other Ambulatory Visit: Payer: Self-pay | Admitting: Internal Medicine

## 2019-11-14 ENCOUNTER — Other Ambulatory Visit: Payer: Self-pay

## 2019-11-14 MED ORDER — FLUOCINONIDE 0.05 % EX CREA: TOPICAL_CREAM | CUTANEOUS | 2 refills | Status: DC

## 2019-11-21 ENCOUNTER — Ambulatory Visit: Payer: 59

## 2019-11-21 ENCOUNTER — Telehealth: Payer: Self-pay | Admitting: Internal Medicine

## 2019-11-21 NOTE — Telephone Encounter (Signed)
Is it okay to give pt a tdap vaccine?

## 2019-11-21 NOTE — Telephone Encounter (Signed)
Pt is requesting a order for a tdap.

## 2019-11-21 NOTE — Telephone Encounter (Signed)
Yes give him Tdap

## 2019-11-22 ENCOUNTER — Ambulatory Visit (INDEPENDENT_AMBULATORY_CARE_PROVIDER_SITE_OTHER): Payer: 59 | Admitting: *Deleted

## 2019-11-22 ENCOUNTER — Other Ambulatory Visit: Payer: Self-pay

## 2019-11-22 DIAGNOSIS — Z23 Encounter for immunization: Secondary | ICD-10-CM

## 2019-11-23 NOTE — Telephone Encounter (Signed)
Completed.

## 2019-12-12 ENCOUNTER — Ambulatory Visit: Payer: 59 | Admitting: Internal Medicine

## 2019-12-12 ENCOUNTER — Other Ambulatory Visit: Payer: Self-pay

## 2019-12-12 ENCOUNTER — Other Ambulatory Visit: Payer: Self-pay | Admitting: Internal Medicine

## 2019-12-12 ENCOUNTER — Encounter: Payer: Self-pay | Admitting: Internal Medicine

## 2019-12-12 VITALS — BP 125/84 | HR 89 | Temp 97.6°F | Ht 72.0 in | Wt 373.0 lb

## 2019-12-12 DIAGNOSIS — E781 Pure hyperglyceridemia: Secondary | ICD-10-CM

## 2019-12-12 DIAGNOSIS — L409 Psoriasis, unspecified: Secondary | ICD-10-CM | POA: Diagnosis not present

## 2019-12-12 DIAGNOSIS — F419 Anxiety disorder, unspecified: Secondary | ICD-10-CM

## 2019-12-12 DIAGNOSIS — Z8616 Personal history of COVID-19: Secondary | ICD-10-CM | POA: Diagnosis not present

## 2019-12-12 DIAGNOSIS — R0600 Dyspnea, unspecified: Secondary | ICD-10-CM | POA: Diagnosis not present

## 2019-12-12 DIAGNOSIS — B372 Candidiasis of skin and nail: Secondary | ICD-10-CM | POA: Diagnosis not present

## 2019-12-12 DIAGNOSIS — R0609 Other forms of dyspnea: Secondary | ICD-10-CM

## 2019-12-12 MED ORDER — NYSTATIN 100000 UNIT/GM EX POWD
1.0000 | Freq: Two times a day (BID) | CUTANEOUS | 1 refills | Status: DC
Start: 2019-12-12 — End: 2019-12-12

## 2019-12-12 NOTE — Assessment & Plan Note (Addendum)
aggravated by wife's health issues and workload /school studies.  Improved with lexapro . No changes today

## 2019-12-12 NOTE — Assessment & Plan Note (Signed)
Managed suboptimally with fluocinolide due to patient's avoidance of Humira and other biological agents. Due to concern about immunocompromise during COVID pandemic

## 2019-12-12 NOTE — Assessment & Plan Note (Signed)
Discussed pharmactherapy.  Limited by recurrent palpitations.  Starting with benefiber,  Will consider Contrave

## 2019-12-12 NOTE — Patient Instructions (Signed)
Bupropion; Naltrexone extended-release tablets What is this medicine? BUPROPION; NALTREXONE (byoo PROE pee on; nal TREX one) is a combination of two drugs that help you lose weight. This product is used with a reduced calorie diet and exercise. This product can also help you maintain weight loss. This medicine may be used for other purposes; ask your health care provider or pharmacist if you have questions. COMMON BRAND NAME(S): Contrave What should I tell my health care provider before I take this medicine? They need to know if you have any of these conditions:  an eating disorder, such as anorexia or bulimia  diabetes  depression  glaucoma  head injury  heart disease  high blood pressure  history of drug abuse or alcohol abuse problem  history of a tumor or infection of your brain or spine  history of heart attack or stroke  history of irregular heartbeat  if you often drink alcohol  kidney disease  liver disease  low levels of sodium in the blood  mental illness  seizures  suicidal thoughts, plans, or attempt; a previous suicide attempt by you or a family member  taken an MAOI like Carbex, Eldepryl, Marplan, Nardil, or Parnate in last 14 days  an unusual or allergic reaction to bupropion, naltrexone, other medicines, foods, dyes, or preservatives  breast-feeding  pregnant or trying to become pregnant How should I use this medicine? Take this medicine by mouth with a glass of water. Follow the directions on the prescription label. Do not cut, crush or chew this medicine. Swallow the tablets whole. You can take it with or without food. Do not take with high-fat meals as this may increase your risk of seizures. Take your medicine at regular intervals. Do not take it more often than directed. Do not stop taking except on your doctor's advice. A special MedGuide will be given to you by the pharmacist with each prescription and refill. Be sure to read this  information carefully each time. Talk to your pediatrician regarding the use of this medicine in children. Special care may be needed. Overdosage: If you think you have taken too much of this medicine contact a poison control center or emergency room at once. NOTE: This medicine is only for you. Do not share this medicine with others. What if I miss a dose? If you miss a dose, skip it. Take your next dose at the normal time. Do not take extra or 2 doses at the same time to make up for the missed dose. What may interact with this medicine? Do not take this medicine with any of the following medications:  any medicines used to stop taking opioids such as methadone or buprenorphine  linezolid  MAOIs like Carbex, Eldepryl, Marplan, Nardil, and Parnate  methylene blue (injected into a vein)  often take narcotic medicines for pain or cough  other medicines that contain bupropion like Zyban or Wellbutrin This medicine may also interact with the following medications:  alcohol  certain medicines for blood pressure like metoprolol, propranolol  certain medicines for depression, anxiety, or psychotic disturbances  certain medicines for HIV or hepatitis  certain medicines for irregular heart beat like propafenone, flecainide  certain medicines for Parkinson's disease like amantadine, levodopa  certain medicines for seizures like carbamazepine, phenytoin, phenobarbital  certain medicines for sleep  cimetidine  clopidogrel  cyclophosphamide  digoxin  disulfiram  furazolidone  isoniazid  nicotine  orphenadrine  procarbazine  steroid medicines like prednisone or cortisone  stimulant medicines for attention disorders,   weight loss, or to stay awake  tamoxifen  theophylline  thiotepa  ticlopidine  tramadol  warfarin This list may not describe all possible interactions. Give your health care provider a list of all the medicines, herbs, non-prescription drugs, or  dietary supplements you use. Also tell them if you smoke, drink alcohol, or use illegal drugs. Some items may interact with your medicine. What should I watch for while using this medicine? Visit your doctor or healthcare provider for regular checks on your progress. This medicine may cause serious skin reactions. They can happen weeks to months after starting the medicine. Contact your healthcare provider right away if you notice fevers or flu-like symptoms with a rash. The rash may be red or purple and then turn into blisters or peeling of the skin. Or, you might notice a red rash with swelling of the face, lips or lymph nodes in your neck or under your arms. This medicine may affect blood sugar. Ask your healthcare provider if changes in diet or medicines are needed if you have diabetes. Patients and their families should watch out for new or worsening depression or thoughts of suicide. Also watch out for sudden changes in feelings such as feeling anxious, agitated, panicky, irritable, hostile, aggressive, impulsive, severely restless, overly excited and hyperactive, or not being able to sleep. If this happens, especially at the beginning of treatment or after a change in dose, call your healthcare provider. Avoid alcoholic drinks while taking this medicine. Drinking large amounts of alcoholic beverages, using sleeping or anxiety medicines, or quickly stopping the use of these agents while taking this medicine may increase your risk for a seizure. Do not drive or use heavy machinery until you know how this medicine affects you. This medicine can impair your ability to perform these tasks. Women should inform their health care provider if they wish to become pregnant or think they might be pregnant. Losing weight while pregnant is not advised and may cause harm to the unborn child. Talk to your health care provider for more information. What side effects may I notice from receiving this medicine? Side  effects that you should report to your doctor or health care professional as soon as possible:  allergic reactions like skin rash, itching or hives, swelling of the face, lips, or tongue  breathing problems  changes in vision  confusion  elevated mood, decreased need for sleep, racing thoughts, impulsive behavior  fast or irregular heartbeat  hallucinations, loss of contact with reality  increased blood pressure  rash, fever, and swollen lymph nodes  redness, blistering, peeling, or loosening of the skin, including inside the mouth  seizures  signs and symptoms of liver injury like dark yellow or brown urine; general ill feeling or flu-like symptoms; light-colored stools; loss of appetite; nausea; right upper belly pain; unusually weak or tired; yellowing of the eyes or skin  suicidal thoughts or other mood changes  vomiting Side effects that usually do not require medical attention (report to your doctor or health care professional if they continue or are bothersome):  constipation  headache  loss of appetite  indigestion, stomach upset  tremors This list may not describe all possible side effects. Call your doctor for medical advice about side effects. You may report side effects to FDA at 1-800-FDA-1088. Where should I keep my medicine? Keep out of the reach of children. Store at room temperature between 15 and 30 degrees C (59 and 86 degrees F). Throw away any unused medicine after the   expiration date. NOTE: This sheet is a summary. It may not cover all possible information. If you have questions about this medicine, talk to your doctor, pharmacist, or health care provider.  2020 Elsevier/Gold Standard (2019-06-02 16:26:28)  

## 2019-12-12 NOTE — Assessment & Plan Note (Signed)
Nystatin powder bid  

## 2019-12-12 NOTE — Progress Notes (Signed)
Subjective:  Patient ID: Mark Palmer, male    DOB: 11/06/1990  Age: 29 y.o. MRN: [id number]  CC: The primary encounter diagnosis was Hypertriglyceridemia. Diagnoses of Morbid obesity (HCC), Personal history of covid-19, Exertional dyspnea, Anxiety, Candidiasis, intertriginous, and Psoriasis were also pertinent to this visit.  HPI Mark Palmer presents for follow up on multiple issues   This visit occurred during the SARS-CoV-2 public health emergency.  Safety protocols were in place, including screening questions prior to the visit, additional usage of staff PPE, and extensive cleaning of exam room while observing appropriate contact time as indicated for disinfecting solutions.   . Patient has received NO  doses of the available COVID 19 vaccines.   Patient continues to mask when outside of the home except when walking in yard or at safe distances from others .  Patient denies any change in mood or development of unhealthy behaviors resuting from the pandemic's restriction of activities and socialization.    Patient had COVID INFECTION IN March ,  He and wife , started feeling bad after wife's [facility] appt.  Wife's family was all positive. Quarantined at home. Very mild case   .weight gain of  37 lbs since last seen Dec 2019. 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  Stressed out by juggling full time job and doing courses on line every night from 5:40 to 8:40 .  2 hours of relaxation per night .  He and wife have started a walking program after she has recovered from nasal resection of a pituitary tumor  . He has not lost weight yet,  Has not been successful at cutting back. Less stressed since job changed.   Using CPAP  Sleeping better,  Fewer palpitations. occurring less than once a week     Skin infection In fold of abdomen.  Has psoriasis managed with  fluocinolide,  Has deferred Humira due to fear of decreased immunity   Outpatient Medications Prior to Visit  Medication Sig Dispense Refill  . ALPRAZolam (XANAX) 0.5 MG tablet Take 1 tablet (0.5 mg total) by mouth at bedtime as needed for anxiety. 30 tablet 1  . escitalopram (LEXAPRO) 20 MG tablet TAKE 1 TABLET BY MOUTH DAILY. 90 tablet 3  . fluocinonide cream (LIDEX) 0.05 % APPLY TO THE AFFECTED AREA(S) 2 TIMES DAILY. 60 g 2  . metoprolol succinate (TOPROL-XL) 25 MG 24 hr tablet TAKE 1 TABLET (25 MG TOTAL) BY MOUTH DAILY. 90 tablet 3  . Omega-3 Fatty Acids (FISH OIL) 1000 MG CAPS Take 3 capsules (3,000 mg total) by mouth daily. 90 capsule 11  . pantoprazole (PROTONIX) 40 MG tablet TAKE 1 TABLET BY MOUTH DAILY. 90 tablet 3  . Vitamin D, Cholecalciferol, 50 MCG (2000 UT) CAPS Take 1 capsule by mouth daily.     No facility-administered medications prior to visit.    Review of Systems;  Patient denies headache, fevers, malaise, unintentional weight loss, skin rash, eye pain, sinus congestion and sinus pain, sore throat, dysphagia,  hemoptysis , cough, dyspnea, wheezing, chest pain, palpitations, orthopnea, edema, abdominal pain, nausea, melena, diarrhea, constipation, flank pain, dysuria, hematuria, urinary  Frequency, nocturia, numbness, tingling, seizures,  Focal weakness, Loss of consciousness,  Tremor, insomnia, depression, anxiety, and suicidal ideation.      Objective:  BP 125/84   Pulse 89   Temp 97.6 F (36.4 C) (Temporal)   Ht  6' (1.829 m)   Wt (!) 373 lb (169.2 kg)   SpO2 97%   BMI 50.59 kg/m   BP Readings from Last 3 Encounters:  12/12/19 125/84  05/12/19 118/90  10/12/18 136/90    Wt Readings from Last 3 Encounters:  12/12/19 (!) 373 lb (169.2 kg)  05/12/19 (!) 364 lb 12 oz (165.4 kg)  10/12/18 (!) 343 lb 12 oz (155.9 kg)    General appearance: alert, cooperative and appears stated age Ears: normal TM's and external ear canals both ears Throat: lips, mucosa,  and tongue normal; teeth and gums normal Neck: no adenopathy, no carotid bruit, supple, symmetrical, trachea midline and thyroid not enlarged, symmetric, no tenderness/mass/nodules Back: symmetric, no curvature. ROM normal. No CVA tenderness. Lungs: clear to auscultation bilaterally Heart: regular rate and rhythm, S1, S2 normal, no murmur, click, rub or gallop Abdomen: soft, non-tender; bowel sounds normal; no masses,  no organomegaly Pulses: 2+ and symmetric Skin: Skin color, texture, turgor normal. No rashes or lesions Lymph nodes: Cervical, supraclavicular, and axillary nodes normal.  Lab Results  Component Value Date   HGBA1C 5.7 02/22/2018   HGBA1C 5.5 09/27/2017   HGBA1C 5.3 01/24/2016    Lab Results  Component Value Date   CREATININE 0.93 01/24/2019   CREATININE 1.04 02/22/2018   CREATININE 1.09 07/08/2017    Lab Results  Component Value Date   WBC 7.5 07/25/2018   HGB 14.5 07/25/2018   HCT 42.3 07/25/2018   PLT 253.0 07/25/2018   GLUCOSE 80 01/24/2019   CHOL 223 (H) 02/22/2018   TRIG (H) 02/22/2018    592.0 Triglyceride is over 400; calculations on Lipids are invalid.   HDL 31.70 (L) 02/22/2018   LDLDIRECT 103.0 02/22/2018   LDLCALC 98 01/24/2016   ALT 13 02/22/2018   AST 19 02/22/2018   NA 137 01/24/2019   K 4.0 01/24/2019   CL 102 01/24/2019   CREATININE 0.93 01/24/2019   BUN 11 01/24/2019   CO2 27 01/24/2019   TSH 0.93 02/22/2018   HGBA1C 5.7 02/22/2018    US Abdomen Limited RUQ  Result Date: 10/29/2017 CLINICAL DATA:  Abdominal pain EXAM: ULTRASOUND ABDOMEN LIMITED RIGHT UPPER QUADRANT COMPARISON:  None. FINDINGS: Gallbladder: No gallstones or wall thickening visualized. No sonographic Murphy sign noted by sonographer. Common bile duct: Diameter: 2.8 mm. Liver: Slightly increased echogenicity without focal mass. This likely represents fatty infiltration. Portal vein is patent on color Doppler imaging with normal direction of blood flow towards the liver.  IMPRESSION: Mild fatty infiltration of the liver. Electronically Signed   By: Alcide Clever M.D.   On: 10/29/2017 11:06    Assessment & Plan:   Problem List Items Addressed This Visit      Unprioritized   Anxiety    aggravated by wife's health issues and workload /school studies.  Improved with lexapro . No changes today       Candidiasis, intertriginous    Nystatin powder bid       Relevant Medications   nystatin (MYCOSTATIN/NYSTOP) powder   Hypertriglyceridemia - Primary   Relevant Orders   Lipid panel   Morbid obesity (HCC)    Discussed pharmactherapy.  Limited by recurrent palpitations.  Starting with benefiber,  Will consider Contrave       Relevant Orders   Comprehensive metabolic panel   Hemoglobin A1c   TSH   Personal history of covid-19    Mild case in march,  Infected by wife's family.  Antibody screen pending  Relevant Orders   SARS-COV-2 IgG   Psoriasis    Managed suboptimally with fluocinolide due to patient's avoidance of Humira and other biological agents. Due to concern about immunocompromise during COVID pandemic        Other Visit Diagnoses    Exertional dyspnea       Relevant Orders   DG Chest 2 View      I am having Mark Palmer start on nystatin. I am also having him maintain his Fish Oil, ALPRAZolam, metoprolol succinate, escitalopram, pantoprazole, fluocinonide cream, and Vitamin D (Cholecalciferol).  Meds ordered this encounter  Medications  . nystatin (MYCOSTATIN/NYSTOP) powder    Sig: Apply 1 application topically 2 (two) times daily. To rash until resolved.    Dispense:  60 g    Refill:  1    There are no discontinued medications.  Follow-up: No follow-ups on file.   Crecencio Mc, MD

## 2019-12-12 NOTE — Assessment & Plan Note (Signed)
Mild case in march,  Infected by wife's family.  Antibody screen pending

## 2019-12-13 ENCOUNTER — Telehealth: Payer: Self-pay

## 2019-12-13 ENCOUNTER — Other Ambulatory Visit (INDEPENDENT_AMBULATORY_CARE_PROVIDER_SITE_OTHER): Payer: 59

## 2019-12-13 ENCOUNTER — Ambulatory Visit (INDEPENDENT_AMBULATORY_CARE_PROVIDER_SITE_OTHER): Payer: 59

## 2019-12-13 DIAGNOSIS — E781 Pure hyperglyceridemia: Secondary | ICD-10-CM | POA: Diagnosis not present

## 2019-12-13 DIAGNOSIS — Z8616 Personal history of COVID-19: Secondary | ICD-10-CM | POA: Diagnosis not present

## 2019-12-13 DIAGNOSIS — R0600 Dyspnea, unspecified: Secondary | ICD-10-CM

## 2019-12-13 DIAGNOSIS — R0609 Other forms of dyspnea: Secondary | ICD-10-CM

## 2019-12-13 LAB — SARS-COV-2 IGG: SARS-COV-2 IgG: 1.61

## 2019-12-13 LAB — COMPREHENSIVE METABOLIC PANEL
ALT: 9 U/L (ref 0–53)
AST: 16 U/L (ref 0–37)
Albumin: 4.3 g/dL (ref 3.5–5.2)
Alkaline Phosphatase: 103 U/L (ref 39–117)
BUN: 15 mg/dL (ref 6–23)
CO2: 30 mEq/L (ref 19–32)
Calcium: 9 mg/dL (ref 8.4–10.5)
Chloride: 102 mEq/L (ref 96–112)
Creatinine, Ser: 0.86 mg/dL (ref 0.40–1.50)
GFR: 104.74 mL/min (ref >60.00)
Glucose, Bld: 119 mg/dL — ABNORMAL HIGH (ref 70–99)
Potassium: 4.3 mEq/L (ref 3.5–5.1)
Sodium: 137 mEq/L (ref 135–145)
Total Bilirubin: 0.6 mg/dL (ref 0.2–1.2)
Total Protein: 7 g/dL (ref 6.0–8.3)

## 2019-12-13 LAB — LIPID PANEL
Cholesterol: 223 mg/dL — ABNORMAL HIGH (ref 0–200)
HDL: 34.7 mg/dL — ABNORMAL LOW (ref >39.00)
Total CHOL/HDL Ratio: 6
Triglycerides: 401 mg/dL — ABNORMAL HIGH (ref 0.0–149.0)

## 2019-12-13 LAB — TSH: TSH: 0.91 u[IU]/mL (ref 0.35–4.50)

## 2019-12-13 LAB — LDL CHOLESTEROL, DIRECT: Direct LDL: 117 mg/dL

## 2019-12-13 LAB — HEMOGLOBIN A1C: Hgb A1c MFr Bld: 6.2 % (ref 4.6–6.5)

## 2019-12-13 NOTE — Telephone Encounter (Signed)
Pt requested lab results. Advised pt that labs have not been resulted yet.

## 2019-12-20 ENCOUNTER — Telehealth: Payer: Self-pay | Admitting: Internal Medicine

## 2019-12-20 NOTE — Telephone Encounter (Signed)
3 attempts to schedule fu appt from recall list.   Deleting recall.   

## 2020-02-29 ENCOUNTER — Other Ambulatory Visit: Payer: Self-pay | Admitting: Internal Medicine

## 2020-03-14 DIAGNOSIS — G4733 Obstructive sleep apnea (adult) (pediatric): Secondary | ICD-10-CM | POA: Diagnosis not present

## 2020-04-29 IMAGING — DX DG HAND COMPLETE 3+V*R*
3 series · 3 of 3 positions shown · non-contrast
Comparison: None.

CLINICAL DATA: Intermittent right hand pain for the past several
weeks associated with tightness and numbness. No known injury.

EXAM:
RIGHT HAND - COMPLETE 3+ VIEW

[hand pa]
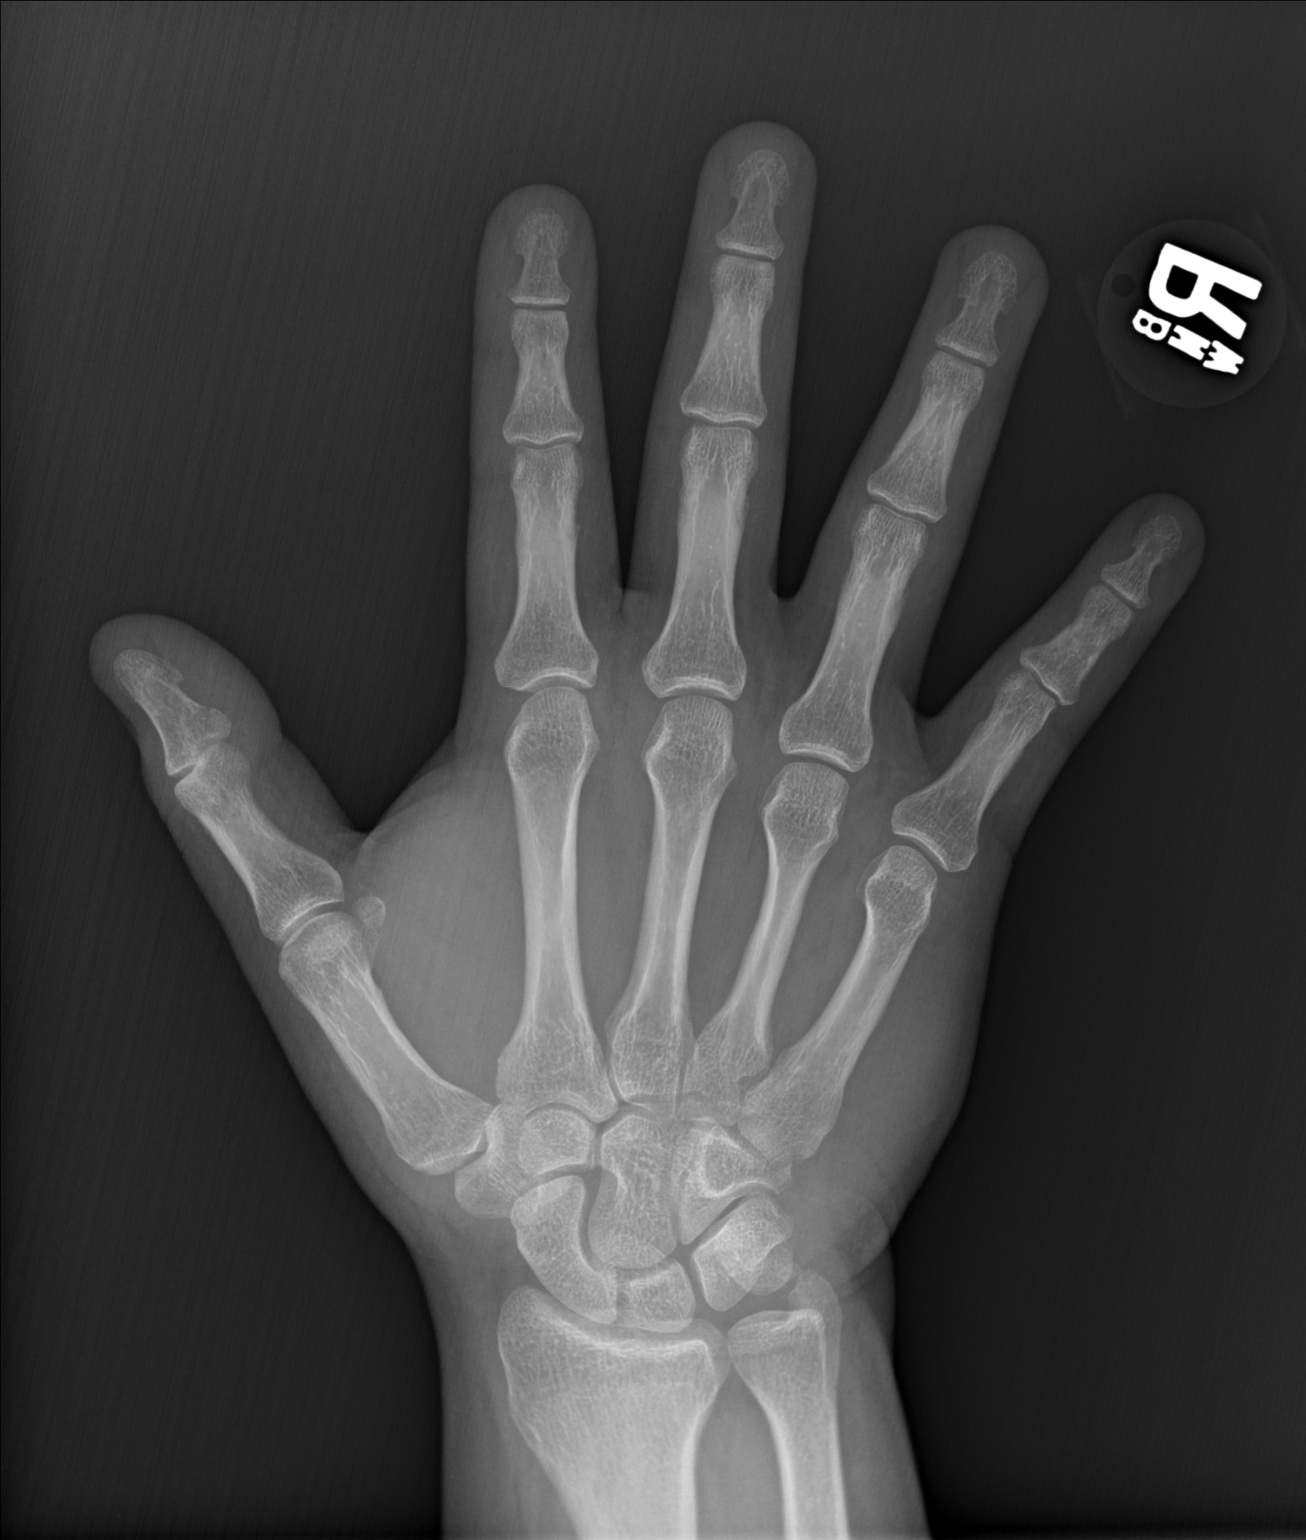

[hand obl (oblique)]
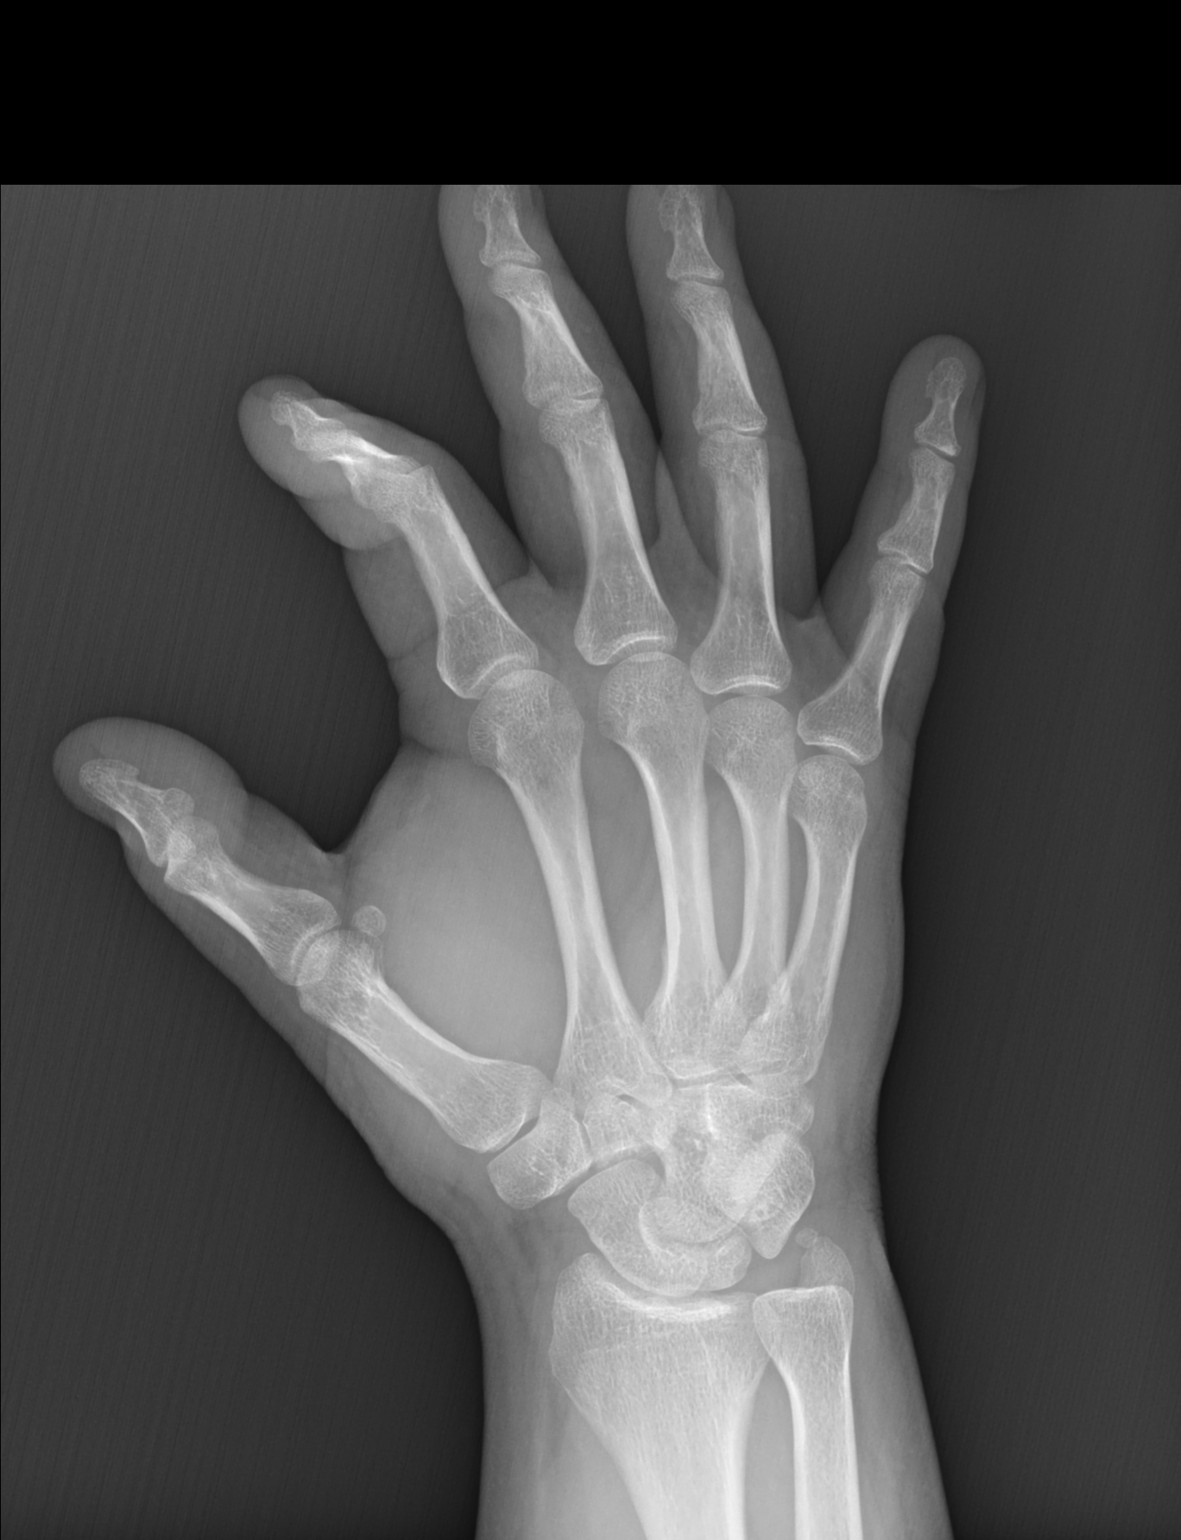

[hand lat]
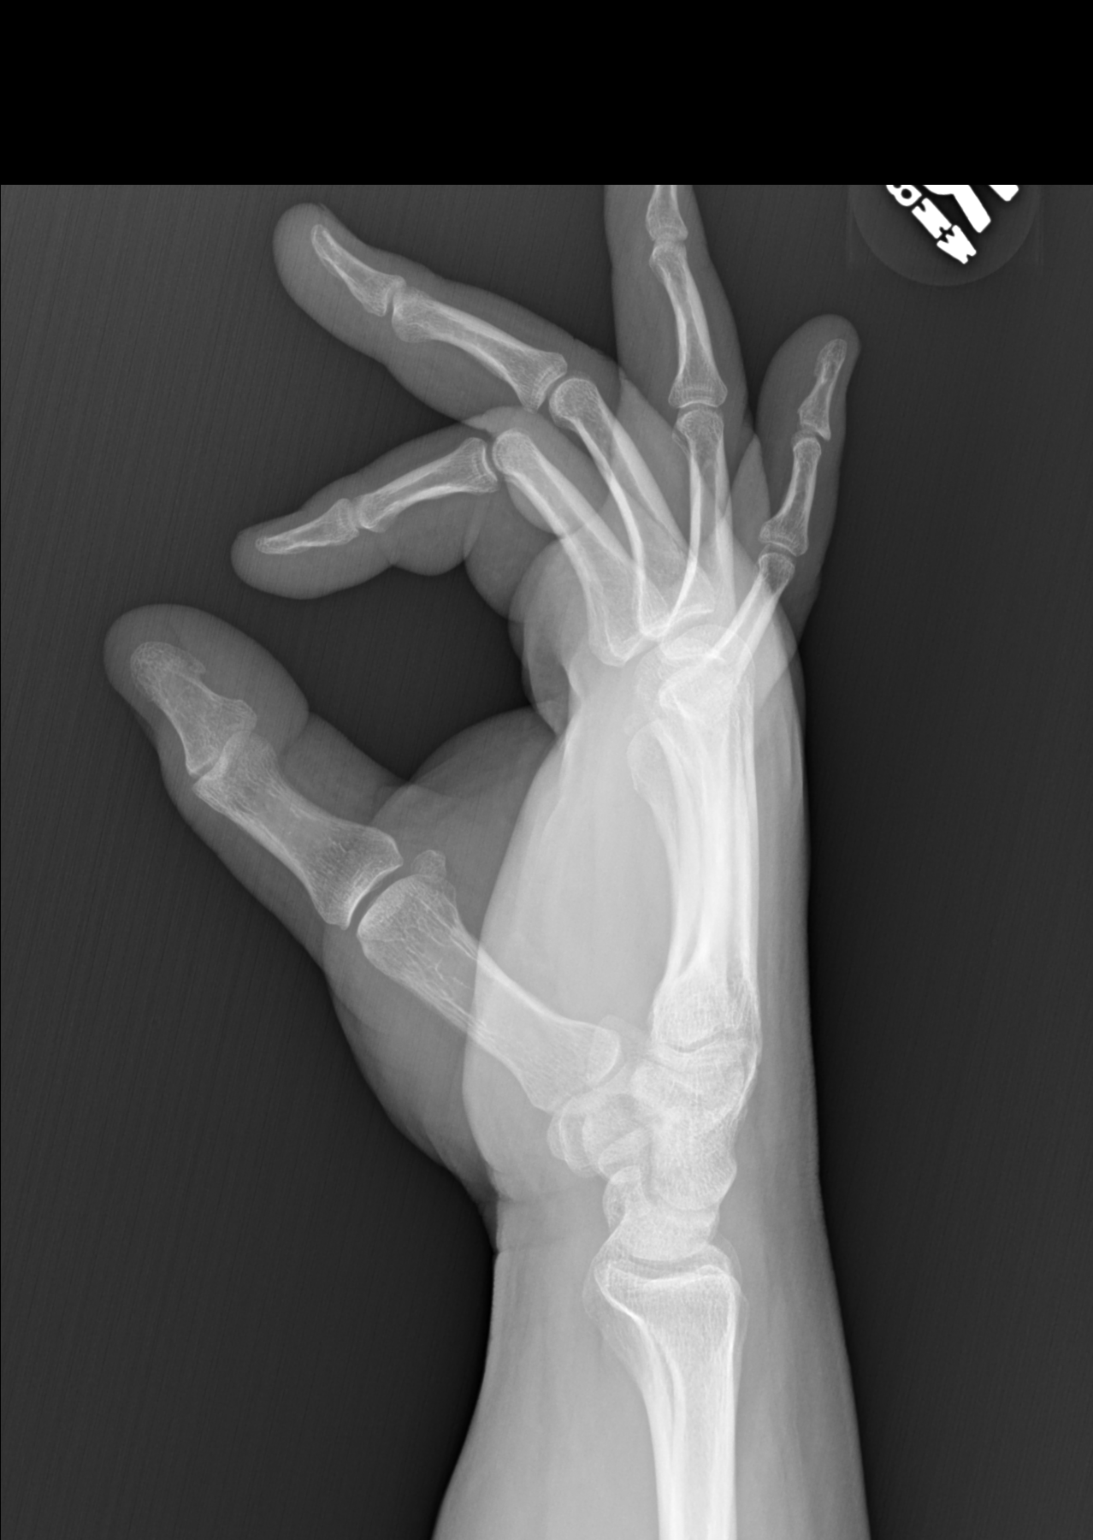

[3 of 3 positions shown; findings below may reference images not displayed]

FINDINGS: The bones are subjectively adequately mineralized. The joint spaces
are well maintained. No lytic or blastic bony lesions are observed.
There is no acute or healing fracture. The soft tissues are
unremarkable.
IMPRESSION: There is no acute or significant chronic bony abnormality of the
right hand.

## 2020-06-18 ENCOUNTER — Telehealth: Payer: Self-pay

## 2020-06-18 ENCOUNTER — Other Ambulatory Visit: Payer: Self-pay | Admitting: Internal Medicine

## 2020-06-18 MED ORDER — LEVOFLOXACIN 500 MG PO TABS
500.0000 mg | ORAL_TABLET | Freq: Every day | ORAL | 0 refills | Status: DC
Start: 2020-06-18 — End: 2020-06-18

## 2020-06-18 NOTE — Telephone Encounter (Signed)
Yes, levaquin sent to [facility].  Daily use of a probiotic advised for 3 weeks.

## 2020-06-18 NOTE — Telephone Encounter (Signed)
Pt is aware.  

## 2020-06-18 NOTE — Addendum Note (Signed)
Addended by: Sherlene Shams on: 06/18/2020 04:41 PM   Modules accepted: Orders

## 2020-06-18 NOTE — Telephone Encounter (Signed)
Spoke with pt today and he stated that he had dental surgery on 05/31/2020. He developed an abscess after the surgery and it popped today. Pt wants to know if an antibiotic can be called in. Pt's dental surgery was in [place].

## 2020-07-03 ENCOUNTER — Other Ambulatory Visit: Payer: Self-pay | Admitting: Internal Medicine

## 2020-08-12 ENCOUNTER — Telehealth: Payer: Self-pay | Admitting: Internal Medicine

## 2020-08-12 ENCOUNTER — Other Ambulatory Visit: Payer: Self-pay | Admitting: Internal Medicine

## 2020-08-12 MED ORDER — AZITHROMYCIN 250 MG PO TABS: ORAL_TABLET | ORAL | 0 refills | Status: DC

## 2020-08-12 NOTE — Telephone Encounter (Signed)
Pt stated that over the weekend after cleaning out his chicken coop he developed nasal drainage and PND. Pt is getting tested for covid this morning per [employer] [facility] [employer]. Pt is wanting to know if a zpak could be sent in.

## 2020-08-12 NOTE — Telephone Encounter (Signed)
Pt is aware that medication has been sent in.  

## 2020-08-12 NOTE — Telephone Encounter (Signed)
Done

## 2020-08-12 NOTE — Telephone Encounter (Signed)
Patient states he has drainage, [facility] is making him get tested for covid. He believes it is a sinus infections and is requesting a Z pack. No available appointments.

## 2020-09-06 ENCOUNTER — Telehealth (INDEPENDENT_AMBULATORY_CARE_PROVIDER_SITE_OTHER): Payer: 59 | Admitting: Internal Medicine

## 2020-09-06 ENCOUNTER — Ambulatory Visit (INDEPENDENT_AMBULATORY_CARE_PROVIDER_SITE_OTHER): Payer: 59

## 2020-09-06 ENCOUNTER — Other Ambulatory Visit: Payer: Self-pay | Admitting: Internal Medicine

## 2020-09-06 DIAGNOSIS — R0789 Other chest pain: Secondary | ICD-10-CM | POA: Diagnosis not present

## 2020-09-06 DIAGNOSIS — R0781 Pleurodynia: Secondary | ICD-10-CM

## 2020-09-06 DIAGNOSIS — Z043 Encounter for examination and observation following other accident: Secondary | ICD-10-CM | POA: Diagnosis not present

## 2020-09-06 DIAGNOSIS — M7918 Myalgia, other site: Secondary | ICD-10-CM

## 2020-09-06 DIAGNOSIS — R079 Chest pain, unspecified: Secondary | ICD-10-CM | POA: Diagnosis not present
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
## 2020-09-10 DIAGNOSIS — G4733 Obstructive sleep apnea (adult) (pediatric): Secondary | ICD-10-CM | POA: Diagnosis not present

## 2020-09-25 ENCOUNTER — Other Ambulatory Visit: Payer: Self-pay | Admitting: Internal Medicine

## 2020-11-27 ENCOUNTER — Other Ambulatory Visit: Payer: Self-pay

## 2020-11-27 MED FILL — Metoprolol Succinate Tab ER 24HR 25 MG (Tartrate Equiv): ORAL | 90 days supply | Qty: 90 | Fill #0 | Status: AC

## 2020-11-27 MED FILL — Escitalopram Oxalate Tab 20 MG (Base Equiv): ORAL | 90 days supply | Qty: 90 | Fill #0 | Status: CN

## 2020-12-10 DIAGNOSIS — G4733 Obstructive sleep apnea (adult) (pediatric): Secondary | ICD-10-CM | POA: Diagnosis not present

## 2020-12-23 ENCOUNTER — Other Ambulatory Visit: Payer: Self-pay | Admitting: Internal Medicine

## 2020-12-23 DIAGNOSIS — E781 Pure hyperglyceridemia: Secondary | ICD-10-CM

## 2020-12-23 DIAGNOSIS — Z8616 Personal history of COVID-19: Secondary | ICD-10-CM

## 2020-12-23 DIAGNOSIS — R7303 Prediabetes: Secondary | ICD-10-CM

## 2020-12-23 DIAGNOSIS — M79661 Pain in right lower leg: Secondary | ICD-10-CM

## 2020-12-24 ENCOUNTER — Other Ambulatory Visit (INDEPENDENT_AMBULATORY_CARE_PROVIDER_SITE_OTHER): Payer: 59

## 2020-12-24 ENCOUNTER — Telehealth: Payer: Self-pay | Admitting: Internal Medicine

## 2020-12-24 ENCOUNTER — Other Ambulatory Visit: Payer: Self-pay

## 2020-12-24 DIAGNOSIS — Z8616 Personal history of COVID-19: Secondary | ICD-10-CM

## 2020-12-24 DIAGNOSIS — E781 Pure hyperglyceridemia: Secondary | ICD-10-CM | POA: Diagnosis not present

## 2020-12-24 DIAGNOSIS — R7303 Prediabetes: Secondary | ICD-10-CM | POA: Diagnosis not present

## 2020-12-24 DIAGNOSIS — M79661 Pain in right lower leg: Secondary | ICD-10-CM | POA: Diagnosis not present

## 2020-12-24 LAB — LIPID PANEL
Cholesterol: 218 mg/dL — ABNORMAL HIGH (ref 0–200)
HDL: 34.2 mg/dL — ABNORMAL LOW (ref >39.00)
Total CHOL/HDL Ratio: 6
Triglycerides: 609 mg/dL — ABNORMAL HIGH (ref 0.0–149.0)

## 2020-12-24 LAB — COMPREHENSIVE METABOLIC PANEL
ALT: 9 U/L (ref 0–53)
AST: 16 U/L (ref 0–37)
Albumin: 4.2 g/dL (ref 3.5–5.2)
Alkaline Phosphatase: 125 U/L — ABNORMAL HIGH (ref 39–117)
BUN: 13 mg/dL (ref 6–23)
CO2: 28 mEq/L (ref 19–32)
Calcium: 9.3 mg/dL (ref 8.4–10.5)
Chloride: 98 mEq/L (ref 96–112)
Creatinine, Ser: 0.97 mg/dL (ref 0.40–1.50)
GFR: 104.7 mL/min (ref >60.00)
Glucose, Bld: 265 mg/dL — ABNORMAL HIGH (ref 70–99)
Potassium: 4 mEq/L (ref 3.5–5.1)
Sodium: 134 mEq/L — ABNORMAL LOW (ref 135–145)
Total Bilirubin: 0.8 mg/dL (ref 0.2–1.2)
Total Protein: 7.3 g/dL (ref 6.0–8.3)

## 2020-12-24 LAB — LDL CHOLESTEROL, DIRECT: Direct LDL: 79 mg/dL

## 2020-12-24 LAB — D-DIMER, QUANTITATIVE: D-Dimer, Quant: 0.21 mcg/mL FEU (ref <0.50)

## 2020-12-24 LAB — HEMOGLOBIN A1C: Hgb A1c MFr Bld: 8.9 % — ABNORMAL HIGH (ref 4.6–6.5)

## 2020-12-24 MED FILL — Fluocinonide Cream 0.05%: CUTANEOUS | 15 days supply | Qty: 60 | Fill #0 | Status: AC

## 2020-12-24 MED FILL — Pantoprazole Sodium EC Tab 40 MG (Base Equiv): ORAL | 90 days supply | Qty: 90 | Fill #0 | Status: AC

## 2020-12-24 MED FILL — Escitalopram Oxalate Tab 20 MG (Base Equiv): ORAL | 90 days supply | Qty: 90 | Fill #0 | Status: AC

## 2020-12-24 NOTE — Telephone Encounter (Signed)
Advised patient labs are in but not resulted by PCP at this time.

## 2020-12-24 NOTE — Telephone Encounter (Signed)
Patient called inquiring about his lab results.

## 2020-12-25 ENCOUNTER — Other Ambulatory Visit: Payer: Self-pay | Admitting: Internal Medicine

## 2020-12-25 ENCOUNTER — Other Ambulatory Visit: Payer: Self-pay

## 2020-12-25 DIAGNOSIS — E669 Obesity, unspecified: Secondary | ICD-10-CM

## 2020-12-25 DIAGNOSIS — E1169 Type 2 diabetes mellitus with other specified complication: Secondary | ICD-10-CM | POA: Insufficient documentation

## 2020-12-25 MED ORDER — METFORMIN HCL 500 MG PO TABS
500.0000 mg | ORAL_TABLET | Freq: Two times a day (BID) | ORAL | 3 refills | Status: DC
Start: 2020-12-25 — End: 2022-01-22
  Filled 2020-12-25: qty 180, 90d supply, fill #0
  Filled 2021-03-28: qty 180, 90d supply, fill #1
  Filled 2021-07-15: qty 180, 90d supply, fill #2
  Filled 2021-10-20: qty 60, 30d supply, fill #3
  Filled 2021-11-26: qty 60, 30d supply, fill #4
  Filled 2021-12-25: qty 60, 30d supply, fill #5

## 2020-12-25 NOTE — Assessment & Plan Note (Signed)
Starting metformin and recommending use of glucometer to check fasting and PP prior to next weeks visit.

## 2020-12-27 LAB — SARS-COV-2 SEMI-QUANTITATIVE TOTAL ANTIBODY, SPIKE: SARS COV2 AB, Total Spike Semi QN: >2500 U/mL — ABNORMAL HIGH (ref <0.8)

## 2021-01-02 ENCOUNTER — Other Ambulatory Visit: Payer: Self-pay

## 2021-01-02 ENCOUNTER — Ambulatory Visit (INDEPENDENT_AMBULATORY_CARE_PROVIDER_SITE_OTHER): Payer: 59 | Admitting: Internal Medicine

## 2021-01-02 ENCOUNTER — Encounter: Payer: Self-pay | Admitting: Internal Medicine

## 2021-01-02 VITALS — BP 128/84 | HR 77 | Temp 98.1°F | Ht 72.0 in | Wt 393.0 lb

## 2021-01-02 DIAGNOSIS — M25541 Pain in joints of right hand: Secondary | ICD-10-CM | POA: Diagnosis not present

## 2021-01-02 DIAGNOSIS — Z23 Encounter for immunization: Secondary | ICD-10-CM

## 2021-01-02 DIAGNOSIS — E781 Pure hyperglyceridemia: Secondary | ICD-10-CM

## 2021-01-02 DIAGNOSIS — M25542 Pain in joints of left hand: Secondary | ICD-10-CM | POA: Diagnosis not present

## 2021-01-02 DIAGNOSIS — Z0000 Encounter for general adult medical examination without abnormal findings: Secondary | ICD-10-CM

## 2021-01-02 DIAGNOSIS — G4733 Obstructive sleep apnea (adult) (pediatric): Secondary | ICD-10-CM | POA: Diagnosis not present

## 2021-01-02 DIAGNOSIS — E1169 Type 2 diabetes mellitus with other specified complication: Secondary | ICD-10-CM

## 2021-01-02 DIAGNOSIS — E669 Obesity, unspecified: Secondary | ICD-10-CM | POA: Diagnosis not present

## 2021-01-02 DIAGNOSIS — I493 Ventricular premature depolarization: Secondary | ICD-10-CM

## 2021-01-02 DIAGNOSIS — E119 Type 2 diabetes mellitus without complications: Secondary | ICD-10-CM

## 2021-01-02 LAB — MICROALBUMIN / CREATININE URINE RATIO
Creatinine,U: 162.3 mg/dL
Microalb Creat Ratio: 1 mg/g (ref 0.0–30.0)
Microalb, Ur: 1.6 mg/dL (ref 0.0–1.9)

## 2021-01-02 LAB — C-REACTIVE PROTEIN: CRP: <1 mg/dL (ref 0.5–20.0)

## 2021-01-02 LAB — SEDIMENTATION RATE: Sed Rate: 7 mm/hr (ref 0–15)

## 2021-01-02 MED ORDER — OZEMPIC (0.25 OR 0.5 MG/DOSE) 2 MG/1.5ML ~~LOC~~ SOPN
0.5000 mg | PEN_INJECTOR | SUBCUTANEOUS | 4 refills | Status: DC
Start: 2021-01-02 — End: 2022-01-22
  Filled 2021-01-02: qty 1.5, 28d supply, fill #0

## 2021-01-02 NOTE — Progress Notes (Signed)
Patient ID: Mark Palmer, male    DOB: 1990/09/19  Age: 30 y.o. MRN: [id number]  The patient is here for annual preventive examination and management of other chronic and acute problems.   The risk factors are reflected in the social history.  The roster of all physicians providing medical care to patient - is listed in the Snapshot section of the chart.  Activities of daily living:  The patient is 100% independent in all ADLs: dressing, toileting, feeding as well as independent mobility  Home safety : The patient has smoke detectors in the home. They wear seatbelts.  There are no firearms at home. There is no violence in the home.   There is no risks for hepatitis, STDs or HIV. There is no   history of blood transfusion. They have no travel history to infectious disease endemic areas of the world.  The patient has seen their dentist in the last six month. They have seen their eye doctor in the last year. He denies hearing difficulty with regard to whispered voices and some television programs.  They have deferred audiologic testing in the last year.  They do not  have excessive sun exposure. Discussed the need for sun protection: hats, long sleeves and use of sunscreen if there is significant sun exposure.   Diet: the importance of a healthy diet is discussed. He has been eating poorly  And he has  gained 20 lbs since his last visit one year ago. His diet has radically changed as of one week ago due to the diagnosis of type 2 DM .   The benefits of regular aerobic exercise were discussed. He does not exercise regularly   Depression screen: there are no signs or vegative symptoms of depression- irritability, change in appetite, anhedonia, sadness/tearfullness.  The following portions of the patient's history were reviewed and updated as appropriate: allergies, current medications, past family history, past medical history,  past surgical history, past social history  and problem list.  Visual  acuity was not assessed per patient preference since she has regular follow up with her ophthalmologist. Hearing and body mass index were assessed and reviewed.   During the course of the visit the patient was educated and counseled about appropriate screening and preventive services including : fall prevention , diabetes screening, nutrition counseling, colorectal cancer screening, and recommended immunizations.    CC: The primary encounter diagnosis was Arthralgia of both hands. Diagnoses of Type 2 diabetes mellitus with obesity (HCC), OSA (obstructive sleep apnea), Need for pneumococcal vaccination, Joint pain in fingers of both hands, Hypertriglyceridemia, and PVC's (premature ventricular contractions) were also pertinent to this visit.  1) morbid obesity  : weight gain of 20  Lbs since May 2021:  Type 2 DM diagnosed last week with A1c of 8.9  2) Type 2DM:  With hyperglycemia:  Has started metformin as of one week ago  and is wearing a CBG . Readings downloaed and reviewed  3)  History Phelan has a past medical history of Anxiety, Depression, Environmental allergies, GERD (gastroesophageal reflux disease), Psoriasis, and PVC's (premature ventricular contractions).   He has a past surgical history that includes Shoulder arthroscopy with labral repair (Right, 2012) and Wisdom tooth extraction.   His family history includes ADD / ADHD in his sister; Diabetes in his maternal grandfather and maternal uncle; Diabetes Mellitus II (age of onset: 69) in his mother; Heart attack in his maternal grandfather; Heart disease in his maternal uncle; Hyperlipidemia in his mother; Hypertension in  his maternal uncle and mother; Mental illness in his maternal aunt, maternal aunt, maternal uncle, and mother; Prostate cancer (age of onset: 5) in his maternal grandfather; Psoriasis in his father; Pulmonary embolism in his maternal grandfather; Stroke in his maternal grandfather.He reports that he has never smoked. His  smokeless tobacco use includes snuff. He reports current alcohol use. He reports that he does not use drugs.  Outpatient Medications Prior to Visit  Medication Sig Dispense Refill  . ALPRAZolam (XANAX) 0.5 MG tablet Take 1 tablet (0.5 mg total) by mouth at bedtime as needed for anxiety. 30 tablet 1  . escitalopram (LEXAPRO) 20 MG tablet TAKE 1 TABLET BY MOUTH DAILY 90 tablet 3  . fluocinonide cream (LIDEX) 0.05 % APPLY TO THE AFFECTED AREA(S) 2 TIMES DAILY. 60 g 2  . metFORMIN (GLUCOPHAGE) 500 MG tablet Take 1 tablet (500 mg total) by mouth 2 (two) times daily with a meal. 180 tablet 3  . metoprolol succinate (TOPROL-XL) 25 MG 24 hr tablet TAKE 1 TABLET BY MOUTH DAILY. 90 tablet 3  . Omega-3 Fatty Acids (FISH OIL) 1000 MG CAPS Take 3 capsules (3,000 mg total) by mouth daily. 90 capsule 11  . pantoprazole (PROTONIX) 40 MG tablet TAKE 1 TABLET BY MOUTH DAILY. 90 tablet 1  . Vitamin D, Cholecalciferol, 50 MCG (2000 UT) CAPS Take 1 capsule by mouth daily.    Marland Kitchen azithromycin (ZITHROMAX) 250 MG tablet TAKE 2 TABLETS BY MOUTH ON DAY 1 THEN TAKE 1 TABLET DAILY FOR THE NEXT 4 DAYS 6 tablet 0  . levofloxacin (LEVAQUIN) 500 MG tablet TAKE 1 TABLET BY MOUTH DAILY FOR 7 DAYS 7 tablet 0   No facility-administered medications prior to visit.    Review of Systems   Patient denies headache, fevers, malaise, unintentional weight loss, skin rash, eye pain, sinus congestion and sinus pain, sore throat, dysphagia,  hemoptysis , cough, dyspnea, wheezing, chest pain, palpitations, orthopnea, edema, abdominal pain, nausea, melena, diarrhea, constipation, flank pain, dysuria, hematuria, urinary  Frequency, nocturia, numbness, tingling, seizures,  Focal weakness, Loss of consciousness,  Tremor, insomnia, depression, anxiety, and suicidal ideation.     Objective:  BP 128/84   Pulse 77   Temp 98.1 F (36.7 C) (Oral)   Ht 6' (1.829 m)   Wt (!) 393 lb (178.3 kg)   SpO2 96%   BMI 53.30 kg/m   Physical  Exam  General appearance: alert, cooperative and appears stated age Ears: normal TM's and external ear canals both ears Throat: lips, mucosa, and tongue normal; teeth and gums normal Neck: no adenopathy, no carotid bruit, supple, symmetrical, trachea midline and thyroid not enlarged, symmetric, no tenderness/mass/nodules Back: symmetric, no curvature. ROM normal. No CVA tenderness. Lungs: clear to auscultation bilaterally Heart: regular rate and rhythm, S1, S2 normal, no murmur, click, rub or gallop Abdomen: soft, non-tender; bowel sounds normal; no masses,  no organomegaly Pulses: 2+ and symmetric Skin: Skin color, texture, turgor normal. No rashes or lesions Lymph nodes: Cervical, supraclavicular, and axillary nodes normal.  Assessment & Plan:   Problem List Items Addressed This Visit      Unprioritized   Hypertriglyceridemia    Chronic aggravated by hyperglycemia. .  The risk of pancreatitis was reviewed. Will reassess once his a1c is < 8.0.  May need tricor  Lab Results  Component Value Date   CHOL 218 (H) 12/24/2020   HDL 34.20 (L) 12/24/2020   LDLCALC 98 01/24/2016   LDLDIRECT 79.0 12/24/2020   TRIG (H) 12/24/2020  609.0 Triglyceride is over 400; calculations on Lipids are invalid.   CHOLHDL 6 12/24/2020         Joint pain in fingers of both hands    Screening labs repeated to rule out inflammatory arthritis.  Negative  Lab Results  Component Value Date   ESRSEDRATE 7 01/02/2021   Lab Results  Component Value Date   CRP <1.0 01/02/2021        OSA (obstructive sleep apnea)    Diagnosed by sleep study. he is wearing her CPAP every night a minimum of 6 hours per night and notes improved daytime wakefulness and decreased fatigue       PVC's (premature ventricular contractions)    Managed with metoprolol low dose.       Type 2 diabetes mellitus with obesity (HCC)    Secondary to morbid obesity and unhealthy lifestyle.  Metformin tolerated; adding Ozempic  for appetite suppression   Foot exam normal.   Lab Results  Component Value Date   HGBA1C 8.9 (H) 12/24/2020   Lab Results  Component Value Date   MICROALBUR 1.6 01/02/2021           Relevant Medications   Semaglutide,0.25 or 0.5MG /DOS, (OZEMPIC, 0.25 OR 0.5 MG/DOSE,) 2 MG/1.5ML SOPN   Other Relevant Orders   Microalbumin / creatinine urine ratio (Completed)    Other Visit Diagnoses    Arthralgia of both hands    -  Primary   Relevant Orders   C-reactive protein (Completed)   Sedimentation rate (Completed)   Need for pneumococcal vaccination       Relevant Orders   Pneumococcal conjugate vaccine 20-valent (Prevnar 20) (Completed)      I have discontinued Mal Amabile T. Ikeda's azithromycin and levofloxacin. I am also having him start on Ozempic (0.25 or 0.5 MG/DOSE). Additionally, I am having him maintain his Fish Oil, ALPRAZolam, vitamin D3, pantoprazole, fluocinonide cream, escitalopram, metoprolol succinate, and metFORMIN.  Meds ordered this encounter  Medications  . Semaglutide,0.25 or 0.5MG /DOS, (OZEMPIC, 0.25 OR 0.5 MG/DOSE,) 2 MG/1.5ML SOPN    Sig: Inject 0.5 mg into the skin once a week.    Dispense:  1.5 mL    Refill:  4    Medications Discontinued During This Encounter  Medication Reason  . azithromycin (ZITHROMAX) 250 MG tablet   . levofloxacin (LEVAQUIN) 500 MG tablet     Follow-up: No follow-ups on file.   Sherlene Shams, MD

## 2021-01-02 NOTE — Assessment & Plan Note (Signed)
Diagnosed by sleep study. he is wearing her CPAP every night a minimum of 6 hours per night and notes improved daytime wakefulness and decreased fatigue  

## 2021-01-04 NOTE — Assessment & Plan Note (Signed)
Secondary to morbid obesity and unhealthy lifestyle.  Metformin tolerated; adding Ozempic for appetite suppression   Foot exam normal.   Lab Results  Component Value Date   HGBA1C 8.9 (H) 12/24/2020   Lab Results  Component Value Date   MICROALBUR 1.6 01/02/2021

## 2021-01-04 NOTE — Assessment & Plan Note (Signed)
Chronic aggravated by hyperglycemia. .  The risk of pancreatitis was reviewed. Will reassess once his a1c is < 8.0.  May need tricor  Lab Results  Component Value Date   CHOL 218 (H) 12/24/2020   HDL 34.20 (L) 12/24/2020   LDLCALC 98 01/24/2016   LDLDIRECT 79.0 12/24/2020   TRIG (H) 12/24/2020    609.0 Triglyceride is over 400; calculations on Lipids are invalid.   CHOLHDL 6 12/24/2020

## 2021-01-04 NOTE — Assessment & Plan Note (Signed)
Screening labs repeated to rule out inflammatory arthritis.  Negative  Lab Results  Component Value Date   ESRSEDRATE 7 01/02/2021   Lab Results  Component Value Date   CRP <1.0 01/02/2021

## 2021-01-04 NOTE — Assessment & Plan Note (Signed)
Managed with metoprolol low dose.

## 2021-01-10 ENCOUNTER — Other Ambulatory Visit: Payer: Self-pay | Admitting: Internal Medicine

## 2021-01-10 ENCOUNTER — Other Ambulatory Visit: Payer: Self-pay

## 2021-01-10 MED ORDER — FLUOCINONIDE 0.05 % EX CREA
TOPICAL_CREAM | CUTANEOUS | 2 refills | Status: DC
  Filled 2021-01-10: qty 60, fill #0
  Filled 2021-04-28: qty 60, 15d supply, fill #0
  Filled 2021-08-15 – 2021-09-16 (×2): qty 60, 15d supply, fill #1

## 2021-01-10 MED ORDER — METOPROLOL SUCCINATE ER 25 MG PO TB24
ORAL_TABLET | Freq: Every day | ORAL | 3 refills | Status: DC
  Filled 2021-01-10 – 2021-02-25 (×2): qty 90, 90d supply, fill #0
  Filled 2021-04-28 – 2021-05-23 (×2): qty 90, 90d supply, fill #1
  Filled 2021-08-15: qty 90, 90d supply, fill #2
  Filled 2021-10-30: qty 30, 30d supply, fill #3
  Filled 2021-11-26: qty 30, 30d supply, fill #4
  Filled 2021-12-25: qty 30, 30d supply, fill #5

## 2021-01-10 MED ORDER — PANTOPRAZOLE SODIUM 40 MG PO TBEC
DELAYED_RELEASE_TABLET | Freq: Every day | ORAL | 1 refills | Status: DC
  Filled 2021-01-10 – 2021-04-28 (×2): qty 90, 90d supply, fill #0
  Filled 2021-08-15: qty 90, 90d supply, fill #1

## 2021-01-24 ENCOUNTER — Encounter: Payer: Self-pay | Admitting: Internal Medicine

## 2021-01-24 ENCOUNTER — Ambulatory Visit (INDEPENDENT_AMBULATORY_CARE_PROVIDER_SITE_OTHER): Payer: 59 | Admitting: Internal Medicine

## 2021-01-24 DIAGNOSIS — N63 Unspecified lump in unspecified breast: Secondary | ICD-10-CM | POA: Diagnosis not present

## 2021-01-24 MED ORDER — DOXYCYCLINE HYCLATE 100 MG PO TABS: 100.0000 mg | ORAL_TABLET | Freq: Two times a day (BID) | ORAL | 0 refills | Status: DC

## 2021-01-24 NOTE — Patient Instructions (Signed)
Start the doxycycline tonight and take 2 times daily WITH FOOD  WARM COMPRESSES TO AREA SEVERAL TIMES DAILY  IF NO CHANGE ON Monday,  WE'LL ORDER DIAGNOSTIC MAMMOGRAM

## 2021-01-24 NOTE — Progress Notes (Signed)
Subjective:  Patient ID: Mark Palmer, male    DOB: January 31, 1991  Age: 30 y.o. MRN: [id number]  CC: The encounter diagnosis was Breast mass in male.  HPI LYNDEN FLEMMER presents for evaluation of a painful hard lump under his right breast that has been present for a week .  This visit occurred during the SARS-CoV-2 public health emergency.  Safety protocols were in place, including screening questions prior to the visit, additional usage of staff PPE, and extensive cleaning of exam room while observing appropriate contact time as indicated for disinfecting solutions.   Jamarrion states that he noticed it in the shower a week ago.  Thought it might be an abscess,  so he tried to squeeze it but it was too deep.  Has not become larger but feels more solid than one week ago.  Tender to deep palpation but not otherwise.  No bruising discoloration or drainage.    Outpatient Medications Prior to Visit  Medication Sig Dispense Refill   ALPRAZolam (XANAX) 0.5 MG tablet Take 1 tablet (0.5 mg total) by mouth at bedtime as needed for anxiety. 30 tablet 1   escitalopram (LEXAPRO) 20 MG tablet TAKE 1 TABLET BY MOUTH DAILY 90 tablet 3   fluocinonide cream (LIDEX) 0.05 % APPLY TO THE AFFECTED AREA(S) 2 TIMES DAILY. 60 g 2   metFORMIN (GLUCOPHAGE) 500 MG tablet Take 1 tablet (500 mg total) by mouth 2 (two) times daily with a meal. 180 tablet 3   metoprolol succinate (TOPROL-XL) 25 MG 24 hr tablet TAKE 1 TABLET BY MOUTH DAILY. 90 tablet 3   Multiple Vitamin (MULTIVITAMIN) tablet Take 1 tablet by mouth daily.     Omega-3 Fatty Acids (FISH OIL) 1000 MG CAPS Take 3 capsules (3,000 mg total) by mouth daily. 90 capsule 11   pantoprazole (PROTONIX) 40 MG tablet TAKE 1 TABLET BY MOUTH DAILY. 90 tablet 1   Semaglutide,0.25 or 0.5MG /DOS, (OZEMPIC, 0.25 OR 0.5 MG/DOSE,) 2 MG/1.5ML SOPN Inject 0.5 mg into the skin once a week. 1.5 mL 4   Vitamin D, Cholecalciferol, 50 MCG (2000 UT) CAPS Take 1 capsule by mouth daily.  (Patient not taking: Reported on 01/24/2021)     No facility-administered medications prior to visit.    Review of Systems;  Patient denies headache, fevers, malaise, unintentional weight loss, skin rash, eye pain, sinus congestion and sinus pain, sore throat, dysphagia,  hemoptysis , cough, dyspnea, wheezing, chest pain, palpitations, orthopnea, edema, abdominal pain, nausea, melena, diarrhea, constipation, flank pain, dysuria, hematuria, urinary  Frequency, nocturia, numbness, tingling, seizures,  Focal weakness, Loss of consciousness,  Tremor, insomnia, depression, anxiety, and suicidal ideation.      Objective:  BP 130/82 (BP Location: Left Arm, Patient Position: Sitting, Cuff Size: Large)   Pulse 87   Resp 15   Ht 6' (1.829 m)   Wt (!) 390 lb (176.9 kg)   SpO2 97%   BMI 52.89 kg/m   BP Readings from Last 3 Encounters:  01/24/21 130/82  01/02/21 128/84  12/12/19 125/84    Wt Readings from Last 3 Encounters:  01/24/21 (!) 390 lb (176.9 kg)  01/02/21 (!) 393 lb (178.3 kg)  12/12/19 (!) 373 lb (169.2 kg)    General appearance: alert, cooperative and appears stated age Neck: no adenopathy, no carotid bruit, supple, symmetrical, trachea midline and thyroid not enlarged, symmetric, no tenderness/mass/nodules Back: symmetric, no curvature. ROM normal. No CVA tenderness. Lungs: clear to auscultation bilaterally0.5 cm subcutaneous mass in crease of right breast  adjacent to psoriasis patch  Breast:   right breast :  oblonb  Heart: regular rate and rhythm, S1, S2 normal, no murmur, click, rub or gallop Abdomen: soft, non-tender; bowel sounds normal; no masses,  no organomegaly Pulses: 2+ and symmetric Skin: Skin color, texture, turgor normal. No rashes or lesions Lymph nodes: Cervical, supraclavicular, and axillary nodes normal.  Lab Results  Component Value Date   HGBA1C 8.9 (H) 12/24/2020   HGBA1C 6.2 12/13/2019   HGBA1C 5.7 02/22/2018    Lab Results  Component Value  Date   CREATININE 0.97 12/24/2020   CREATININE 0.86 12/13/2019   CREATININE 0.93 01/24/2019    Lab Results  Component Value Date   WBC 7.5 07/25/2018   HGB 14.5 07/25/2018   HCT 42.3 07/25/2018   PLT 253.0 07/25/2018   GLUCOSE 265 (H) 12/24/2020   CHOL 218 (H) 12/24/2020   TRIG (H) 12/24/2020    609.0 Triglyceride is over 400; calculations on Lipids are invalid.   HDL 34.20 (L) 12/24/2020   LDLDIRECT 79.0 12/24/2020   LDLCALC 98 01/24/2016   ALT 9 12/24/2020   AST 16 12/24/2020   NA 134 (L) 12/24/2020   K 4.0 12/24/2020   CL 98 12/24/2020   CREATININE 0.97 12/24/2020   BUN 13 12/24/2020   CO2 28 12/24/2020   TSH 0.91 12/13/2019   HGBA1C 8.9 (H) 12/24/2020   MICROALBUR 1.6 01/02/2021    US Abdomen Limited RUQ    Assessment & Plan:   Problem List Items Addressed This Visit       Unprioritized   Breast mass in male    Mass feels more like a subcutaneous abscess .  Will treat with doxycycline and reassess next week. If no change,  Will obtain Diagnostic mammogram/ultrasound to rule out neoplasm         I have discontinued Mal Amabile T. Albor's vitamin D3. I am also having him start on doxycycline. Additionally, I am having him maintain his Fish Oil, ALPRAZolam, escitalopram, metFORMIN, Ozempic (0.25 or 0.5 MG/DOSE), pantoprazole, fluocinonide cream, metoprolol succinate, and multivitamin.  Meds ordered this encounter  Medications   doxycycline (VIBRA-TABS) 100 MG tablet    Sig: Take 1 tablet (100 mg total) by mouth 2 (two) times daily.    Dispense:  14 tablet    Refill:  0    Medications Discontinued During This Encounter  Medication Reason   Vitamin D, Cholecalciferol, 50 MCG (2000 UT) CAPS     Follow-up: No follow-ups on file.   Sherlene Shams, MD

## 2021-01-24 NOTE — Assessment & Plan Note (Signed)
Mass feels more like a subcutaneous abscess .  Will treat with doxycycline and reassess next week. If no change,  Will obtain Diagnostic mammogram/ultrasound to rule out neoplasm

## 2021-02-25 ENCOUNTER — Other Ambulatory Visit: Payer: Self-pay

## 2021-03-10 DIAGNOSIS — G4733 Obstructive sleep apnea (adult) (pediatric): Secondary | ICD-10-CM | POA: Diagnosis not present

## 2021-03-24 ENCOUNTER — Other Ambulatory Visit: Payer: Self-pay

## 2021-03-24 MED FILL — Escitalopram Oxalate Tab 20 MG (Base Equiv): ORAL | 90 days supply | Qty: 90 | Fill #1 | Status: AC

## 2021-03-28 ENCOUNTER — Other Ambulatory Visit: Payer: Self-pay

## 2021-04-09 ENCOUNTER — Other Ambulatory Visit: Payer: Self-pay

## 2021-04-09 MED ORDER — ALPRAZOLAM 0.5 MG PO TABS
0.5000 mg | ORAL_TABLET | Freq: Every evening | ORAL | 1 refills | Status: DC | PRN
  Filled 2021-04-09 – 2021-04-28 (×2): qty 30, 30d supply, fill #0

## 2021-04-09 NOTE — Telephone Encounter (Signed)
Refilled: 11/23/2018 Last OV: 01/24/2021 Next OV: not scheduled

## 2021-04-10 ENCOUNTER — Other Ambulatory Visit: Payer: Self-pay

## 2021-04-25 ENCOUNTER — Other Ambulatory Visit: Payer: Self-pay

## 2021-04-28 ENCOUNTER — Other Ambulatory Visit: Payer: Self-pay

## 2021-04-29 ENCOUNTER — Telehealth: Payer: Self-pay | Admitting: Pharmacist

## 2021-04-29 NOTE — Telephone Encounter (Signed)
Spoke with patient, he requested sample of Ozempic.    Medication Samples have been provided to the patient.  Drug name: Ozempic       Strength: 8 mg/3 mL        Qty: 1 pen  LOT: [id number]  Exp.Date: 02/06/22  Dosing instructions: Inject 0.5 mg (19 clicks) weekly   The patient has been instructed regarding the correct time, dose, and frequency of taking this medication, including desired effects and most common side effects.   Lourena Simmonds 12:52 PM 04/29/2021  Routing to PCP for [employer]

## 2021-05-13 ENCOUNTER — Telehealth: Payer: Self-pay

## 2021-05-13 DIAGNOSIS — N644 Mastodynia: Secondary | ICD-10-CM

## 2021-05-13 NOTE — Telephone Encounter (Signed)
Yes,  diagnostic mammogram ordered.

## 2021-05-13 NOTE — Telephone Encounter (Signed)
Pt stated that he has been having left breast pain that is located directly behind the nipple. Pain has been going on for 2 weeks, tender to the touch. Pain is noticeable when any pressure is applied. Pain level is 3. No swelling, no redness, no warmth. No known family history of breast cancer. Pt would like to see about getting a mammogram ordered.

## 2021-05-14 ENCOUNTER — Other Ambulatory Visit: Payer: Self-pay | Admitting: Internal Medicine

## 2021-05-14 DIAGNOSIS — N644 Mastodynia: Secondary | ICD-10-CM

## 2021-05-14 NOTE — Telephone Encounter (Signed)
[facility] placed orders for [place] as well that need to be signed.

## 2021-05-14 NOTE — Telephone Encounter (Signed)
Orders have been signed.

## 2021-05-23 ENCOUNTER — Other Ambulatory Visit: Payer: Self-pay

## 2021-05-23 ENCOUNTER — Ambulatory Visit
Admission: RE | Admit: 2021-05-23 | Discharge: 2021-05-23 | Disposition: A | Discharge status: Home / Self Care | Payer: 59 | Source: Ambulatory Visit | Attending: Internal Medicine | Admitting: Internal Medicine

## 2021-05-23 DIAGNOSIS — N644 Mastodynia: Secondary | ICD-10-CM | POA: Insufficient documentation

## 2021-05-23 DIAGNOSIS — N62 Hypertrophy of breast: Secondary | ICD-10-CM | POA: Diagnosis not present

## 2021-06-09 DIAGNOSIS — G4733 Obstructive sleep apnea (adult) (pediatric): Secondary | ICD-10-CM | POA: Diagnosis not present

## 2021-06-23 ENCOUNTER — Other Ambulatory Visit: Payer: Self-pay

## 2021-06-23 ENCOUNTER — Other Ambulatory Visit: Payer: Self-pay | Admitting: Internal Medicine

## 2021-06-23 MED ORDER — ESCITALOPRAM OXALATE 20 MG PO TABS
ORAL_TABLET | Freq: Every day | ORAL | 3 refills | Status: DC
  Filled 2021-06-23: qty 90, 90d supply, fill #0
  Filled 2021-09-16 – 2021-09-24 (×2): qty 30, 30d supply, fill #1
  Filled 2021-10-20: qty 30, 30d supply, fill #2
  Filled 2021-11-26: qty 30, 30d supply, fill #3
  Filled 2021-12-25: qty 30, 30d supply, fill #4
  Filled 2022-01-13: qty 30, 30d supply, fill #5
  Filled 2022-02-21: qty 30, 30d supply, fill #6
  Filled 2022-03-27: qty 30, 30d supply, fill #7
  Filled 2022-04-21: qty 30, 30d supply, fill #8
  Filled 2022-05-21: qty 30, 30d supply, fill #9

## 2021-07-15 ENCOUNTER — Other Ambulatory Visit: Payer: Self-pay

## 2021-08-15 ENCOUNTER — Other Ambulatory Visit: Payer: Self-pay

## 2021-08-18 ENCOUNTER — Other Ambulatory Visit: Payer: Self-pay

## 2021-08-18 ENCOUNTER — Telehealth: Payer: 59 | Admitting: Physician Assistant

## 2021-08-18 DIAGNOSIS — J208 Acute bronchitis due to other specified organisms: Secondary | ICD-10-CM

## 2021-08-18 DIAGNOSIS — B9689 Other specified bacterial agents as the cause of diseases classified elsewhere: Secondary | ICD-10-CM

## 2021-08-18 MED ORDER — BENZONATATE 100 MG PO CAPS
100.0000 mg | ORAL_CAPSULE | Freq: Three times a day (TID) | ORAL | 0 refills | Status: DC | PRN
  Filled 2021-08-18: qty 30, 10d supply, fill #0

## 2021-08-18 MED ORDER — DOXYCYCLINE HYCLATE 100 MG PO TABS
100.0000 mg | ORAL_TABLET | Freq: Two times a day (BID) | ORAL | 0 refills | Status: DC
  Filled 2021-08-18: qty 14, 7d supply, fill #0

## 2021-08-18 NOTE — Patient Instructions (Signed)
Mark Palmer, thank you for joining Leeanne Rio, PA-C for today's virtual visit.  While this provider is not your primary care provider (PCP), if your PCP is located in our provider database this encounter information will be shared with them immediately following your visit.  Consent: (Patient) Mark Palmer provided verbal consent for this virtual visit at the beginning of the encounter.  Current Medications:  Current Outpatient Medications:    ALPRAZolam (XANAX) 0.5 MG tablet, Take 1 tablet (0.5 mg total) by mouth at bedtime as needed for anxiety., Disp: 30 tablet, Rfl: 1   doxycycline (VIBRA-TABS) 100 MG tablet, Take 1 tablet (100 mg total) by mouth 2 (two) times daily., Disp: 14 tablet, Rfl: 0   escitalopram (LEXAPRO) 20 MG tablet, TAKE 1 TABLET BY MOUTH DAILY, Disp: 90 tablet, Rfl: 3   fluocinonide cream (LIDEX) 0.05 %, APPLY TO THE AFFECTED AREA(S) 2 TIMES DAILY., Disp: 60 g, Rfl: 2   metFORMIN (GLUCOPHAGE) 500 MG tablet, Take 1 tablet (500 mg total) by mouth 2 (two) times daily with a meal., Disp: 180 tablet, Rfl: 3   metoprolol succinate (TOPROL-XL) 25 MG 24 hr tablet, TAKE 1 TABLET BY MOUTH DAILY., Disp: 90 tablet, Rfl: 3   Multiple Vitamin (MULTIVITAMIN) tablet, Take 1 tablet by mouth daily., Disp: , Rfl:    Omega-3 Fatty Acids (FISH OIL) 1000 MG CAPS, Take 3 capsules (3,000 mg total) by mouth daily., Disp: 90 capsule, Rfl: 11   pantoprazole (PROTONIX) 40 MG tablet, TAKE 1 TABLET BY MOUTH DAILY., Disp: 90 tablet, Rfl: 1   Semaglutide,0.25 or 0.5MG /DOS, (OZEMPIC, 0.25 OR 0.5 MG/DOSE,) 2 MG/1.5ML SOPN, Inject 0.5 mg into the skin once a week., Disp: 1.5 mL, Rfl: 4   Medications ordered in this encounter:  No orders of the defined types were placed in this encounter.    *If you need refills on other medications prior to your next appointment, please contact your pharmacy*  Follow-Up: Call back or seek an in-person evaluation if the symptoms worsen or if the condition  fails to improve as anticipated.  Other Instructions Take antibiotic (Doxycycline) as directed.  Increase fluids.  Get plenty of rest. Use Mucinex for congestion. Use Tessalon as well to help with cough. Take a daily probiotic (I recommend Align or Culturelle, but even Activia Yogurt may be beneficial).  A humidifier placed in the bedroom may offer some relief for a dry, scratchy throat of nasal irritation.  Read information below on acute bronchitis. Please call or return to clinic if symptoms are not improving.  Acute Bronchitis Bronchitis is when the airways that extend from the windpipe into the lungs get red, puffy, and painful (inflamed). Bronchitis often causes thick spit (mucus) to develop. This leads to a cough. A cough is the most common symptom of bronchitis. In acute bronchitis, the condition usually begins suddenly and goes away over time (usually in 2 weeks). Smoking, allergies, and asthma can make bronchitis worse. Repeated episodes of bronchitis may cause more lung problems.  HOME CARE Rest. Drink enough fluids to keep your pee (urine) clear or pale yellow (unless you need to limit fluids as told by your doctor). Only take over-the-counter or prescription medicines as told by your doctor. Avoid smoking and secondhand smoke. These can make bronchitis worse. If you are a smoker, think about using nicotine gum or skin patches. Quitting smoking will help your lungs heal faster. Reduce the chance of getting bronchitis again by: Washing your hands often. Avoiding people with cold  symptoms. Trying not to touch your hands to your mouth, nose, or eyes. Follow up with your doctor as told.  GET HELP IF: Your symptoms do not improve after 1 week of treatment. Symptoms include: Cough. Fever. Coughing up thick spit. Body aches. Chest congestion. Chills. Shortness of breath. Sore throat.  GET HELP RIGHT AWAY IF:  You have an increased fever. You have chills. You have severe  shortness of breath. You have bloody thick spit (sputum). You throw up (vomit) often. You lose too much body fluid (dehydration). You have a severe headache. You faint.  MAKE SURE YOU:  Understand these instructions. Will watch your condition. Will get help right away if you are not doing well or get worse. Document Released: 01/13/2008 Document Revised: 03/29/2013 Document Reviewed: 01/17/2013 [facility] Patient Information 2015 [place], [place]. This information is not intended to replace advice given to you by your health care provider. Make sure you discuss any questions you have with your health care provider.    If you have been instructed to have an in-person evaluation today at a local Urgent Care facility, please use the link below. It will take you to a list of all of our available [facility], including address, phone number and hours of operation. Please do not delay care.  [facility]  If you or a family member do not have a primary care provider, use the link below to schedule a visit and establish care. When you choose a [facility] primary care physician or advanced practice provider, you gain a long-term partner in health. Find a Primary Care Provider  Learn more about [facility]'s in-office and virtual care options: [facility]

## 2021-08-18 NOTE — Progress Notes (Signed)
Virtual Visit Consent   COREY LASKI, you are scheduled for a virtual visit with a [facility] provider today.     Just as with appointments in the office, your consent must be obtained to participate.  Your consent will be active for this visit and any virtual visit you may have with one of our providers in the next 365 days.     If you have a MyChart account, a copy of this consent can be sent to you electronically.  All virtual visits are billed to your insurance company just like a traditional visit in the office.    As this is a virtual visit, video technology does not allow for your provider to perform a traditional examination.  This may limit your provider's ability to fully assess your condition.  If your provider identifies any concerns that need to be evaluated in person or the need to arrange testing (such as labs, EKG, etc.), we will make arrangements to do so.     Although advances in technology are sophisticated, we cannot ensure that it will always work on either your end or our end.  If the connection with a video visit is poor, the visit may have to be switched to a telephone visit.  With either a video or telephone visit, we are not always able to ensure that we have a secure connection.     I need to obtain your verbal consent now.   Are you willing to proceed with your visit today?    AADITH RAUDENBUSH has provided verbal consent on 08/18/2021 for a virtual visit (video or telephone).   Piedad Climes, [place]   Date: 08/18/2021 9:23 AM   Virtual Visit via Video Note   I, Piedad Climes, connected with  SHYNE LEHRKE  ([id number], Aug 10, 1991) on 08/18/21 at  9:15 AM EST by a video-enabled telemedicine application and verified that I am speaking with the correct person using two identifiers.  Location: Patient: Virtual Visit Location Patient: Home Provider: Virtual Visit Location Provider: Office/Clinic   I discussed the limitations of evaluation and management by  telemedicine and the availability of in person appointments. The patient expressed understanding and agreed to proceed.    History of Present Illness: ARUSH GATLIFF is a 31 y.o. who identifies as a male who was assigned male at birth, and is being seen today for possible bronchitis after influenza 2 weeks ago. Notes continual chest congestion and cough that is mainly dry despite the amount of congestion in upper chest. Denies fever, chills, aches. Some occasional tightness for which albuterol inhaler helps. Is hydrating and resting. Is taking OTC Nyquil/Dayquil.    HPI: HPI  Problems:  Patient Active Problem List   Diagnosis Date Noted   Breast mass in male 01/24/2021   Type 2 diabetes mellitus with obesity (HCC) 12/25/2020   Candidiasis, intertriginous 12/12/2019   Personal history of COVID-19 12/12/2019   Joint pain in fingers of both hands 07/24/2018   Acute midline thoracic back pain 04/13/2018   OSA (obstructive sleep apnea) 03/17/2018   PVC's (premature ventricular contractions) 03/09/2018   Hypertriglyceridemia 02/23/2018   Morbid obesity (HCC) 08/26/2017   Psoriasis 08/26/2017   Anxiety 01/27/2016   Palpitations 01/27/2016    Allergies:  Allergies  Allergen Reactions   Clindamycin/Lincomycin Rash   Amoxicillin Swelling   Medications:  Current Outpatient Medications:    benzonatate (TESSALON) 100 MG capsule, Take 1 capsule (100 mg total) by mouth 3 (three) times daily  as needed for cough., Disp: 30 capsule, Rfl: 0   doxycycline (VIBRA-TABS) 100 MG tablet, Take 1 tablet (100 mg total) by mouth 2 (two) times daily., Disp: 14 tablet, Rfl: 0   ALPRAZolam (XANAX) 0.5 MG tablet, Take 1 tablet (0.5 mg total) by mouth at bedtime as needed for anxiety., Disp: 30 tablet, Rfl: 1   escitalopram (LEXAPRO) 20 MG tablet, TAKE 1 TABLET BY MOUTH DAILY, Disp: 90 tablet, Rfl: 3   fluocinonide cream (LIDEX) 0.05 %, APPLY TO THE AFFECTED AREA(S) 2 TIMES DAILY., Disp: 60 g, Rfl: 2   metFORMIN  (GLUCOPHAGE) 500 MG tablet, Take 1 tablet (500 mg total) by mouth 2 (two) times daily with a meal., Disp: 180 tablet, Rfl: 3   metoprolol succinate (TOPROL-XL) 25 MG 24 hr tablet, TAKE 1 TABLET BY MOUTH DAILY., Disp: 90 tablet, Rfl: 3   Multiple Vitamin (MULTIVITAMIN) tablet, Take 1 tablet by mouth daily., Disp: , Rfl:    Omega-3 Fatty Acids (FISH OIL) 1000 MG CAPS, Take 3 capsules (3,000 mg total) by mouth daily., Disp: 90 capsule, Rfl: 11   pantoprazole (PROTONIX) 40 MG tablet, TAKE 1 TABLET BY MOUTH DAILY., Disp: 90 tablet, Rfl: 1   Semaglutide,0.25 or 0.5MG /DOS, (OZEMPIC, 0.25 OR 0.5 MG/DOSE,) 2 MG/1.5ML SOPN, Inject 0.5 mg into the skin once a week., Disp: 1.5 mL, Rfl: 4  Observations/Objective: Patient is well-developed, well-nourished in no acute distress.  Resting comfortably at home.  Head is normocephalic, atraumatic.  No labored breathing. Speech is clear and coherent with logical content.  Patient is alert and oriented at baseline.   Assessment and Plan: 1. Acute bacterial bronchitis - benzonatate (TESSALON) 100 MG capsule; Take 1 capsule (100 mg total) by mouth 3 (three) times daily as needed for cough.  Dispense: 30 capsule; Refill: 0 - doxycycline (VIBRA-TABS) 100 MG tablet; Take 1 tablet (100 mg total) by mouth 2 (two) times daily.  Dispense: 14 tablet; Refill: 0  Rx Doxycycline.  Increase fluids.  Rest.  Saline nasal spray.  Probiotic.  Mucinex as directed.  Humidifier in bedroom. Tessalon per orders.  Call or return to clinic if symptoms are not improving.   Follow Up Instructions: I discussed the assessment and treatment plan with the patient. The patient was provided an opportunity to ask questions and all were answered. The patient agreed with the plan and demonstrated an understanding of the instructions.  A copy of instructions were sent to the patient via MyChart unless otherwise noted below.   The patient was advised to call back or seek an in-person evaluation if  the symptoms worsen or if the condition fails to improve as anticipated.  Time:  I spent 10 minutes with the patient via telehealth technology discussing the above problems/concerns.    Piedad Climes, PA-C

## 2021-09-01 ENCOUNTER — Other Ambulatory Visit: Payer: Self-pay

## 2021-09-08 ENCOUNTER — Other Ambulatory Visit: Payer: Self-pay

## 2021-09-16 ENCOUNTER — Other Ambulatory Visit: Payer: Self-pay

## 2021-09-24 ENCOUNTER — Other Ambulatory Visit: Payer: Self-pay

## 2021-10-20 ENCOUNTER — Other Ambulatory Visit: Payer: Self-pay

## 2021-10-30 ENCOUNTER — Other Ambulatory Visit: Payer: Self-pay

## 2021-11-26 ENCOUNTER — Other Ambulatory Visit: Payer: Self-pay

## 2021-12-05 ENCOUNTER — Telehealth: Payer: 59 | Admitting: Emergency Medicine

## 2021-12-05 DIAGNOSIS — J029 Acute pharyngitis, unspecified: Secondary | ICD-10-CM

## 2021-12-05 MED ORDER — AZITHROMYCIN 250 MG PO TABS: ORAL_TABLET | ORAL | 0 refills | Status: DC

## 2021-12-05 NOTE — Progress Notes (Signed)
?Virtual Visit Consent  ? ?Bronwen Betters, you are scheduled for a virtual visit with a [facility] provider today.   ?  ?Just as with appointments in the office, your consent must be obtained to participate.  Your consent will be active for this visit and any virtual visit you may have with one of our providers in the next 365 days.   ?  ?If you have a MyChart account, a copy of this consent can be sent to you electronically.  All virtual visits are billed to your insurance company just like a traditional visit in the office.   ? ?As this is a virtual visit, video technology does not allow for your provider to perform a traditional examination.  This may limit your provider's ability to fully assess your condition.  If your provider identifies any concerns that need to be evaluated in person or the need to arrange testing (such as labs, EKG, etc.), we will make arrangements to do so.   ?  ?Although advances in technology are sophisticated, we cannot ensure that it will always work on either your end or our end.  If the connection with a video visit is poor, the visit may have to be switched to a telephone visit.  With either a video or telephone visit, we are not always able to ensure that we have a secure connection.    ? ?Also, by engaging in this virtual visit, you consent to the provision of healthcare. Additionally, you authorize for your insurance to be billed (if applicable) for the services provided during this visit.  ? ?I need to obtain your verbal consent now.   Are you willing to proceed with your visit today?  ?  ?PRATHAM CASSATT has provided verbal consent on 12/05/2021 for a virtual visit (video or telephone). ?  ?Roxy Horseman, PA-C  ? ?Date: 12/05/2021 11:02 AM ? ? ?Virtual Visit via Video Note  ? ?IRoxy Horseman, connected with  HORACIO WERTH  ([id number], 09-Jun-1991) on 12/05/21 at 11:00 AM EDT by a video-enabled telemedicine application and verified that I am speaking with the correct person  using two identifiers. ? ?Location: ?Patient: Virtual Visit Location Patient: Home ?Provider: Virtual Visit Location Provider: Home Office ?  ?I discussed the limitations of evaluation and management by telemedicine and the availability of in person appointments. The patient expressed understanding and agreed to proceed.   ? ?History of Present Illness: ?Mark Palmer is a 31 y.o. who identifies as a male who was assigned male at birth, and is being seen today for sore throat.  He states that he has had it for the past 2.5 weeks.  States that it is primarily on the right side of his throat.  States that he is having a lot of allergies.  Had some sinus problems, but that has improved.  Denies any fever.  Only hurts when he swallows.  Has tried OTC flonase, cold/cough meds, zyrtec. ? ?HPI: HPI  ?Problems:  ?Patient Active Problem List  ? Diagnosis Date Noted  ? Breast mass in male 01/24/2021  ? Type 2 diabetes mellitus with obesity (HCC) 12/25/2020  ? Candidiasis, intertriginous 12/12/2019  ? Personal history of COVID-19 12/12/2019  ? Joint pain in fingers of both hands 07/24/2018  ? Acute midline thoracic back pain 04/13/2018  ? OSA (obstructive sleep apnea) 03/17/2018  ? PVC's (premature ventricular contractions) 03/09/2018  ? Hypertriglyceridemia 02/23/2018  ? Morbid obesity (HCC) 08/26/2017  ? Psoriasis 08/26/2017  ?  Anxiety 01/27/2016  ? Palpitations 01/27/2016  ?  ?Allergies:  ?Allergies  ?Allergen Reactions  ? Clindamycin/Lincomycin Rash  ? Amoxicillin Swelling  ? ?Medications:  ?Current Outpatient Medications:  ?  azithromycin (ZITHROMAX) 250 MG tablet, Take 2 tabs today, then take 1 tab daily until gone., Disp: 6 tablet, Rfl: 0 ?  ALPRAZolam (XANAX) 0.5 MG tablet, Take 1 tablet (0.5 mg total) by mouth at bedtime as needed for anxiety., Disp: 30 tablet, Rfl: 1 ?  benzonatate (TESSALON) 100 MG capsule, Take 1 capsule (100 mg total) by mouth 3 (three) times daily as needed for cough., Disp: 30 capsule, Rfl: 0 ?   doxycycline (VIBRA-TABS) 100 MG tablet, Take 1 tablet (100 mg total) by mouth 2 (two) times daily., Disp: 14 tablet, Rfl: 0 ?  escitalopram (LEXAPRO) 20 MG tablet, TAKE 1 TABLET BY MOUTH DAILY, Disp: 90 tablet, Rfl: 3 ?  fluocinonide cream (LIDEX) 0.05 %, APPLY TO THE AFFECTED AREA(S) 2 TIMES DAILY., Disp: 60 g, Rfl: 2 ?  metFORMIN (GLUCOPHAGE) 500 MG tablet, Take 1 tablet (500 mg total) by mouth 2 (two) times daily with a meal., Disp: 180 tablet, Rfl: 3 ?  metoprolol succinate (TOPROL-XL) 25 MG 24 hr tablet, TAKE 1 TABLET BY MOUTH DAILY., Disp: 90 tablet, Rfl: 3 ?  Multiple Vitamin (MULTIVITAMIN) tablet, Take 1 tablet by mouth daily., Disp: , Rfl:  ?  Omega-3 Fatty Acids (FISH OIL) 1000 MG CAPS, Take 3 capsules (3,000 mg total) by mouth daily., Disp: 90 capsule, Rfl: 11 ?  pantoprazole (PROTONIX) 40 MG tablet, TAKE 1 TABLET BY MOUTH DAILY., Disp: 90 tablet, Rfl: 1 ?  Semaglutide,0.25 or 0.5MG /DOS, (OZEMPIC, 0.25 OR 0.5 MG/DOSE,) 2 MG/1.5ML SOPN, Inject 0.5 mg into the skin once a week., Disp: 1.5 mL, Rfl: 4 ? ?Observations/Objective: ?Patient is well-developed, well-nourished in no acute distress.  ?Resting comfortably  at home.  ?Head is normocephalic, atraumatic.  ?No labored breathing.  ?Speech is clear and coherent with logical content.  ?Patient is alert and oriented at baseline.  ? ? ?Assessment and Plan: ?1. Sore throat ? ?- Trial Z-pak based on length of symptoms. ?- Continue OTC antihistamines ? ? ?Follow Up Instructions: ?I discussed the assessment and treatment plan with the patient. The patient was provided an opportunity to ask questions and all were answered. The patient agreed with the plan and demonstrated an understanding of the instructions.  A copy of instructions were sent to the patient via MyChart unless otherwise noted below.  ? ? ? ?The patient was advised to call back or seek an in-person evaluation if the symptoms worsen or if the condition fails to improve as anticipated. ? ?Time:  ?I  spent 11 minutes with the patient via telehealth technology discussing the above problems/concerns.   ? ?Roxy Horseman, PA-C ? ?

## 2021-12-19 ENCOUNTER — Encounter: Payer: Self-pay | Admitting: Internal Medicine

## 2021-12-25 ENCOUNTER — Other Ambulatory Visit: Payer: Self-pay | Admitting: Internal Medicine

## 2021-12-25 ENCOUNTER — Other Ambulatory Visit: Payer: Self-pay

## 2021-12-25 MED FILL — Pantoprazole Sodium EC Tab 40 MG (Base Equiv): ORAL | 30 days supply | Qty: 30 | Fill #0 | Status: AC

## 2022-01-01 ENCOUNTER — Telehealth: Payer: 59 | Admitting: Nurse Practitioner

## 2022-01-01 DIAGNOSIS — J029 Acute pharyngitis, unspecified: Secondary | ICD-10-CM

## 2022-01-01 MED ORDER — LIDOCAINE VISCOUS HCL 2 % MT SOLN: 10.0000 mL | Freq: Four times a day (QID) | OROMUCOSAL | 0 refills | Status: DC | PRN

## 2022-01-01 MED ORDER — AZITHROMYCIN 250 MG PO TABS: ORAL_TABLET | ORAL | 0 refills | Status: AC

## 2022-01-01 NOTE — Progress Notes (Signed)
Virtual Visit Consent   Mark Palmer, you are scheduled for a virtual visit with a [facility] provider today. Just as with appointments in the office, your consent must be obtained to participate. Your consent will be active for this visit and any virtual visit you may have with one of our providers in the next 365 days. If you have a MyChart account, a copy of this consent can be sent to you electronically.  As this is a virtual visit, video technology does not allow for your provider to perform a traditional examination. This may limit your provider's ability to fully assess your condition. If your provider identifies any concerns that need to be evaluated in person or the need to arrange testing (such as labs, EKG, etc.), we will make arrangements to do so. Although advances in technology are sophisticated, we cannot ensure that it will always work on either your end or our end. If the connection with a video visit is poor, the visit may have to be switched to a telephone visit. With either a video or telephone visit, we are not always able to ensure that we have a secure connection.  By engaging in this virtual visit, you consent to the provision of healthcare and authorize for your insurance to be billed (if applicable) for the services provided during this visit. Depending on your insurance coverage, you may receive a charge related to this service.  I need to obtain your verbal consent now. Are you willing to proceed with your visit today? Mark Palmer has provided verbal consent on 01/01/2022 for a virtual visit (video or telephone). Mark Cantor, NP  Date: 01/01/2022 4:35 PM  Virtual Visit via Video Note   I, Mark Palmer, connected with  Mark Palmer  ([id number], Jul 28, 1991) on 01/01/22 at  4:30 PM EDT by a video-enabled telemedicine application and verified that I am speaking with the correct person using two identifiers.  Location: Patient: Virtual Visit  Location Patient: Home Provider: Virtual Visit Location Provider: Home   I discussed the limitations of evaluation and management by telemedicine and the availability of in person appointments. The patient expressed understanding and agreed to proceed.    History of Present Illness: Mark Palmer is a 31 y.o. who identifies as a male who was assigned male at birth, and is being seen today for sore throat. Symptoms started 5 days ago and have worsened. Has swollen lymph nodes, fever, tonsil swelling and difficulty/pain with swallowing. Since the onset of his symptoms, sore throat has remained. Continues to have throat pain. Has been taking Dayquil/Nyquil for symptoms. History of seasonal allergies. Has also been taking Tylenol. Patient states he is a strep carrier.  HPI: HPI  Problems:  Patient Active Problem List   Diagnosis Date Noted   Breast mass in male 01/24/2021   Type 2 diabetes mellitus with obesity (HCC) 12/25/2020   Candidiasis, intertriginous 12/12/2019   Personal history of COVID-19 12/12/2019   Joint pain in fingers of both hands 07/24/2018   Acute midline thoracic back pain 04/13/2018   OSA (obstructive sleep apnea) 03/17/2018   PVC's (premature ventricular contractions) 03/09/2018   Hypertriglyceridemia 02/23/2018   Morbid obesity (HCC) 08/26/2017   Psoriasis 08/26/2017   Anxiety 01/27/2016   Palpitations 01/27/2016    Allergies:  Allergies  Allergen Reactions   Clindamycin/Lincomycin Rash   Amoxicillin Swelling   Medications:  Current Outpatient Medications:    azithromycin (ZITHROMAX) 250 MG tablet, Take 2 tablets on day  1, then 1 tablet daily on days 2 through 5, Disp: 6 tablet, Rfl: 0   lidocaine (XYLOCAINE) 2 % solution, Use as directed 10 mLs in the mouth or throat every 6 (six) hours as needed for mouth pain., Disp: 100 mL, Rfl: 0   ALPRAZolam (XANAX) 0.5 MG tablet, Take 1 tablet (0.5 mg total) by mouth at bedtime as needed for anxiety., Disp: 30 tablet, Rfl:  1   azithromycin (ZITHROMAX) 250 MG tablet, Take 2 tabs today, then take 1 tab daily until gone., Disp: 6 tablet, Rfl: 0   benzonatate (TESSALON) 100 MG capsule, Take 1 capsule (100 mg total) by mouth 3 (three) times daily as needed for cough., Disp: 30 capsule, Rfl: 0   doxycycline (VIBRA-TABS) 100 MG tablet, Take 1 tablet (100 mg total) by mouth 2 (two) times daily., Disp: 14 tablet, Rfl: 0   escitalopram (LEXAPRO) 20 MG tablet, TAKE 1 TABLET BY MOUTH DAILY, Disp: 90 tablet, Rfl: 3   fluocinonide cream (LIDEX) 0.05 %, APPLY TO THE AFFECTED AREA(S) 2 TIMES DAILY., Disp: 60 g, Rfl: 2   metFORMIN (GLUCOPHAGE) 500 MG tablet, Take 1 tablet (500 mg total) by mouth 2 (two) times daily with a meal., Disp: 180 tablet, Rfl: 3   metoprolol succinate (TOPROL-XL) 25 MG 24 hr tablet, TAKE 1 TABLET BY MOUTH DAILY., Disp: 90 tablet, Rfl: 3   Multiple Vitamin (MULTIVITAMIN) tablet, Take 1 tablet by mouth daily., Disp: , Rfl:    Omega-3 Fatty Acids (FISH OIL) 1000 MG CAPS, Take 3 capsules (3,000 mg total) by mouth daily., Disp: 90 capsule, Rfl: 11   pantoprazole (PROTONIX) 40 MG tablet, TAKE 1 TABLET BY MOUTH DAILY., Disp: 90 tablet, Rfl: 1   Semaglutide,0.25 or 0.5MG /DOS, (OZEMPIC, 0.25 OR 0.5 MG/DOSE,) 2 MG/1.5ML SOPN, Inject 0.5 mg into the skin once a week., Disp: 1.5 mL, Rfl: 4  Observations/Objective: Patient is well-developed, well-nourished in no acute distress.  Resting comfortably at home.  Head is normocephalic, atraumatic.  No labored breathing.  Speech is clear and coherent with logical content.  Patient is alert and oriented at baseline.    Assessment and Plan: 1. Pharyngitis, unspecified etiology - azithromycin (ZITHROMAX) 250 MG tablet; Take 2 tablets on day 1, then 1 tablet daily on days 2 through 5  Dispense: 6 tablet; Refill: 0 - lidocaine (XYLOCAINE) 2 % solution; Use as directed 10 mLs in the mouth or throat every 6 (six) hours as needed for mouth pain.  Dispense: 100 mL; Refill:  0  Take medication as prescribed. Recommend warm saltwater gargles until symptoms improve, along with switching to Ibuprofen to help with pain and inflammation. Recommend a soft diet until symptoms improve. If symptoms do not improve after medication, follow up with PCP.    Follow Up Instructions: I discussed the assessment and treatment plan with the patient. The patient was provided an opportunity to ask questions and all were answered. The patient agreed with the plan and demonstrated an understanding of the instructions.  A copy of instructions were sent to the patient via MyChart unless otherwise noted below.   The patient was advised to call back or seek an in-person evaluation if the symptoms worsen or if the condition fails to improve as anticipated.  Time:  I spent 10 minutes with the patient via telehealth technology discussing the above problems/concerns.    Mark Cantor, NP

## 2022-01-01 NOTE — Patient Instructions (Signed)
Mark Palmer, thank you for joining Abran Cantor, NP for today's virtual visit.  While this provider is not your primary care provider (PCP), if your PCP is located in our provider database this encounter information will be shared with them immediately following your visit.  Consent: (Patient) Mark Palmer provided verbal consent for this virtual visit at the beginning of the encounter.  Current Medications:  Current Outpatient Medications:    azithromycin (ZITHROMAX) 250 MG tablet, Take 2 tablets on day 1, then 1 tablet daily on days 2 through 5, Disp: 6 tablet, Rfl: 0   lidocaine (XYLOCAINE) 2 % solution, Use as directed 10 mLs in the mouth or throat every 6 (six) hours as needed for mouth pain., Disp: 100 mL, Rfl: 0   ALPRAZolam (XANAX) 0.5 MG tablet, Take 1 tablet (0.5 mg total) by mouth at bedtime as needed for anxiety., Disp: 30 tablet, Rfl: 1   azithromycin (ZITHROMAX) 250 MG tablet, Take 2 tabs today, then take 1 tab daily until gone., Disp: 6 tablet, Rfl: 0   benzonatate (TESSALON) 100 MG capsule, Take 1 capsule (100 mg total) by mouth 3 (three) times daily as needed for cough., Disp: 30 capsule, Rfl: 0   doxycycline (VIBRA-TABS) 100 MG tablet, Take 1 tablet (100 mg total) by mouth 2 (two) times daily., Disp: 14 tablet, Rfl: 0   escitalopram (LEXAPRO) 20 MG tablet, TAKE 1 TABLET BY MOUTH DAILY, Disp: 90 tablet, Rfl: 3   fluocinonide cream (LIDEX) 0.05 %, APPLY TO THE AFFECTED AREA(S) 2 TIMES DAILY., Disp: 60 g, Rfl: 2   metFORMIN (GLUCOPHAGE) 500 MG tablet, Take 1 tablet (500 mg total) by mouth 2 (two) times daily with a meal., Disp: 180 tablet, Rfl: 3   metoprolol succinate (TOPROL-XL) 25 MG 24 hr tablet, TAKE 1 TABLET BY MOUTH DAILY., Disp: 90 tablet, Rfl: 3   Multiple Vitamin (MULTIVITAMIN) tablet, Take 1 tablet by mouth daily., Disp: , Rfl:    Omega-3 Fatty Acids (FISH OIL) 1000 MG CAPS, Take 3 capsules (3,000 mg total) by mouth daily., Disp: 90 capsule, Rfl: 11    pantoprazole (PROTONIX) 40 MG tablet, TAKE 1 TABLET BY MOUTH DAILY., Disp: 90 tablet, Rfl: 1   Semaglutide,0.25 or 0.5MG /DOS, (OZEMPIC, 0.25 OR 0.5 MG/DOSE,) 2 MG/1.5ML SOPN, Inject 0.5 mg into the skin once a week., Disp: 1.5 mL, Rfl: 4   Medications ordered in this encounter:  Meds ordered this encounter  Medications   azithromycin (ZITHROMAX) 250 MG tablet    Sig: Take 2 tablets on day 1, then 1 tablet daily on days 2 through 5    Dispense:  6 tablet    Refill:  0   lidocaine (XYLOCAINE) 2 % solution    Sig: Use as directed 10 mLs in the mouth or throat every 6 (six) hours as needed for mouth pain.    Dispense:  100 mL    Refill:  0     *If you need refills on other medications prior to your next appointment, please contact your pharmacy*  Follow-Up: Call back or seek an in-person evaluation if the symptoms worsen or if the condition fails to improve as anticipated.  Other Instructions Take medication as prescribed. Recommend warm saltwater gargles until symptoms improve, along with switching to Ibuprofen to help with pain and inflammation. Recommend a soft diet until symptoms improve. If symptoms do not improve after medication, follow up with PCP.    If you have been instructed to have an in-person evaluation today  at a local Urgent Care facility, please use the link below. It will take you to a list of all of our available [facility], including address, phone number and hours of operation. Please do not delay care.  [facility]  If you or a family member do not have a primary care provider, use the link below to schedule a visit and establish care. When you choose a [facility] primary care physician or advanced practice provider, you gain a long-term partner in health. Find a Primary Care Provider  Learn more about 's in-office and virtual care options: [facility]

## 2022-01-13 ENCOUNTER — Other Ambulatory Visit: Payer: Self-pay

## 2022-01-13 ENCOUNTER — Other Ambulatory Visit: Payer: Self-pay | Admitting: Internal Medicine

## 2022-01-13 MED FILL — Pantoprazole Sodium EC Tab 40 MG (Base Equiv): ORAL | 30 days supply | Qty: 30 | Fill #1 | Status: AC

## 2022-01-14 ENCOUNTER — Other Ambulatory Visit: Payer: Self-pay

## 2022-01-14 MED FILL — Metoprolol Succinate Tab ER 24HR 25 MG (Tartrate Equiv): ORAL | 30 days supply | Qty: 30 | Fill #0 | Status: AC

## 2022-01-15 ENCOUNTER — Other Ambulatory Visit: Payer: Self-pay

## 2022-01-19 ENCOUNTER — Other Ambulatory Visit: Payer: Self-pay

## 2022-01-22 ENCOUNTER — Telehealth: Payer: Self-pay

## 2022-01-22 ENCOUNTER — Ambulatory Visit (INDEPENDENT_AMBULATORY_CARE_PROVIDER_SITE_OTHER): Payer: 59 | Admitting: Internal Medicine

## 2022-01-22 ENCOUNTER — Encounter: Payer: Self-pay | Admitting: Internal Medicine

## 2022-01-22 ENCOUNTER — Other Ambulatory Visit: Payer: Self-pay

## 2022-01-22 VITALS — BP 128/94 | HR 81 | Temp 98.0°F | Ht 72.0 in | Wt 391.0 lb

## 2022-01-22 DIAGNOSIS — Z79899 Other long term (current) drug therapy: Secondary | ICD-10-CM

## 2022-01-22 DIAGNOSIS — Z0000 Encounter for general adult medical examination without abnormal findings: Secondary | ICD-10-CM

## 2022-01-22 DIAGNOSIS — E669 Obesity, unspecified: Secondary | ICD-10-CM

## 2022-01-22 DIAGNOSIS — N62 Hypertrophy of breast: Secondary | ICD-10-CM

## 2022-01-22 DIAGNOSIS — E1169 Type 2 diabetes mellitus with other specified complication: Secondary | ICD-10-CM

## 2022-01-22 DIAGNOSIS — E781 Pure hyperglyceridemia: Secondary | ICD-10-CM | POA: Diagnosis not present

## 2022-01-22 DIAGNOSIS — Z114 Encounter for screening for human immunodeficiency virus [HIV]: Secondary | ICD-10-CM

## 2022-01-22 DIAGNOSIS — Z1159 Encounter for screening for other viral diseases: Secondary | ICD-10-CM | POA: Diagnosis not present

## 2022-01-22 LAB — POCT GLYCOSYLATED HEMOGLOBIN (HGB A1C): Hemoglobin A1C: 6.2 % — AB (ref 4.0–5.6)

## 2022-01-22 MED ORDER — ALPRAZOLAM 0.5 MG PO TABS
0.5000 mg | ORAL_TABLET | Freq: Every evening | ORAL | 1 refills | Status: DC | PRN
Start: 2022-01-22 — End: 2022-10-19
  Filled 2022-01-22: qty 30, 30d supply, fill #0
  Filled 2022-05-21: qty 30, 30d supply, fill #1

## 2022-01-22 MED ORDER — TIRZEPATIDE 2.5 MG/0.5ML ~~LOC~~ SOAJ: 2.5000 mg | SUBCUTANEOUS | 1 refills | Status: DC

## 2022-01-22 MED ORDER — ONDANSETRON HCL 4 MG PO TABS
4.0000 mg | ORAL_TABLET | Freq: Three times a day (TID) | ORAL | 0 refills | Status: DC | PRN
  Filled 2022-01-22: qty 20, 7d supply, fill #0

## 2022-01-22 MED ORDER — METFORMIN HCL 850 MG PO TABS: 850.0000 mg | ORAL_TABLET | Freq: Two times a day (BID) | ORAL | 1 refills | Status: DC

## 2022-01-22 MED ORDER — DOXYCYCLINE HYCLATE 100 MG PO TABS
100.0000 mg | ORAL_TABLET | Freq: Two times a day (BID) | ORAL | 0 refills | Status: DC
  Filled 2022-01-22: qty 20, 10d supply, fill #0

## 2022-01-22 NOTE — Patient Instructions (Addendum)
I have increased metformin to 850 mg bid  Start Mounjaro  while we await prior authorization  MEAL PREP TO KEEP YOU ON TRACK   [facility] Spotted Fever  can present with fever and a headache, NOT a rash; s the occurrence of these symptoms during spring/summer/fall in the context of recent tick exposure is RMSF until proven otherwise and should be treated immediately with doxycycline.  I have sent this rx to your pharmacy to use if you develop these symptoms.  Let me know if this occurs so we can set you up for the appropriate testing .

## 2022-01-22 NOTE — Progress Notes (Unsigned)
The patient is here for annual preventive examination and management of other chronic and acute problems.   The risk factors are reflected in the social history.   The roster of all physicians providing medical care to patient - is listed in the Snapshot section of the chart.   Activities of daily living:  The patient is 100% independent in all ADLs: dressing, toileting, feeding as well as independent mobility   Home safety : The patient has smoke detectors in the home. They wear seatbelts.  There are no unsecured firearms at home. There is no violence in the home.    There is no risks for hepatitis, STDs or HIV. There is no   history of blood transfusion. They have no travel history to infectious disease endemic areas of the world.   The patient has seen their dentist in the last six month. They have seen their eye doctor in the last year. The patinet  denies slight hearing difficulty with regard to whispered voices and some television programs.  They have deferred audiologic testing in the last year.  They do not  have excessive sun exposure. Discussed the need for sun protection: hats, long sleeves and use of sunscreen if there is significant sun exposure.    Diet: the importance of a healthy diet is discussed. They do have a healthy diet.   The benefits of regular aerobic exercise were discussed. The patient  exercises  3 to 5 days per week  for  60 minutes.    Depression screen: there are no signs or vegative symptoms of depression- irritability, change in appetite, anhedonia, sadness/tearfullness.   The following portions of the patient's history were reviewed and updated as appropriate: allergies, current medications, past family history, past medical history,  past surgical history, past social history  and problem list.   Visual acuity was not assessed per patient preference since the patient has regular follow up with an  ophthalmologist. Hearing and body mass index were assessed and  reviewed.    During the course of the visit the patient was educated and counseled about appropriate screening and preventive services including : fall prevention , diabetes screening, nutrition counseling, colorectal cancer screening, and recommended immunizations.    Chief Complaint:  1) obesity/DM :  personal best 376 with ozempic.   Got down to 288 in 2018 ON HIS OWN.  Working from home.  Overeating   walks 30 minutes.  Wants to resume a GLP agonist.  Not checking sugars.  Does not meal prep.   Eats 3 sandwiches daily on white bread.  Eats red meat several days per week and ice cream  every other night  ( 2 scoops )     Review of Symptoms  Patient denies headache, fevers, malaise, unintentional weight loss, skin rash, eye pain, sinus congestion and sinus pain, sore throat, dysphagia,  hemoptysis , cough, dyspnea, wheezing, chest pain, palpitations, orthopnea, edema, abdominal pain, nausea, melena, diarrhea, constipation, flank pain, dysuria, hematuria, urinary  Frequency, nocturia, numbness, tingling, seizures,  Focal weakness, Loss of consciousness,  Tremor, insomnia, depression, anxiety, and suicidal ideation.    Physical Exam:  BP (!) 128/94 (BP Location: Left Arm, Patient Position: Sitting, Cuff Size: Large)   Pulse 81   Temp 98 F (36.7 C) (Oral)   Ht 6' (1.829 m)   Wt (!) 391 lb (177.4 kg)   SpO2 97%   BMI 53.03 kg/m      Assessment and Plan:  No problem-specific Assessment & Plan  notes found for this encounter.   Updated Medication List Outpatient Encounter Medications as of 01/22/2022  Medication Sig   ALPRAZolam (XANAX) 0.5 MG tablet Take 1 tablet (0.5 mg total) by mouth at bedtime as needed for anxiety.   escitalopram (LEXAPRO) 20 MG tablet TAKE 1 TABLET BY MOUTH DAILY   metFORMIN (GLUCOPHAGE) 500 MG tablet Take 1 tablet (500 mg total) by mouth 2 (two) times daily with a meal.   metoprolol succinate (TOPROL-XL) 25 MG 24 hr tablet TAKE 1 TABLET BY MOUTH DAILY.    pantoprazole (PROTONIX) 40 MG tablet TAKE 1 TABLET BY MOUTH DAILY.   [DISCONTINUED] lidocaine (XYLOCAINE) 2 % solution Use as directed 10 mLs in the mouth or throat every 6 (six) hours as needed for mouth pain.   [DISCONTINUED] azithromycin (ZITHROMAX) 250 MG tablet Take 2 tabs today, then take 1 tab daily until gone.   [DISCONTINUED] benzonatate (TESSALON) 100 MG capsule Take 1 capsule (100 mg total) by mouth 3 (three) times daily as needed for cough.   [DISCONTINUED] doxycycline (VIBRA-TABS) 100 MG tablet Take 1 tablet (100 mg total) by mouth 2 (two) times daily.   [DISCONTINUED] Multiple Vitamin (MULTIVITAMIN) tablet Take 1 tablet by mouth daily. (Patient not taking: Reported on 01/22/2022)   [DISCONTINUED] Omega-3 Fatty Acids (FISH OIL) 1000 MG CAPS Take 3 capsules (3,000 mg total) by mouth daily.   [DISCONTINUED] Semaglutide,0.25 or 0.5MG /DOS, (OZEMPIC, 0.25 OR 0.5 MG/DOSE,) 2 MG/1.5ML SOPN Inject 0.5 mg into the skin once a week.   No facility-administered encounter medications on file as of 01/22/2022.

## 2022-01-22 NOTE — Telephone Encounter (Signed)
Medication Samples have been provided to the patient.  Drug name: Greggory Keen       Strength: 2.5 mg        Qty: 1 box  LOT: [id number]  Exp.Date: 07/22/2023  Dosing instructions: Inject 2.5 mg into sink once weekly.   The patient has been instructed regarding the correct time, dose, and frequency of taking this medication, including desired effects and most common side effects.   Tyerra Loretto 8:53 AM 01/22/2022

## 2022-01-22 NOTE — Assessment & Plan Note (Signed)
Controlled currently,  But needs to resume GLP 1 agonist to help manage morbid obesity .  Increase metformin to 850 mg bid and resume Mounjaro.  Samples given

## 2022-01-23 ENCOUNTER — Encounter: Payer: Self-pay | Admitting: Internal Medicine

## 2022-01-23 LAB — CBC WITH DIFFERENTIAL/PLATELET
Basophils Absolute: 0.1 10*3/uL (ref 0.0–0.2)
Basos: 1 %
EOS (ABSOLUTE): 0.2 10*3/uL (ref 0.0–0.4)
Eos: 3 %
Hematocrit: 43.6 % (ref 37.5–51.0)
Hemoglobin: 14.4 g/dL (ref 13.0–17.7)
Immature Grans (Abs): 0 10*3/uL (ref 0.0–0.1)
Immature Granulocytes: 0 %
Lymphocytes Absolute: 2.1 10*3/uL (ref 0.7–3.1)
Lymphs: 29 %
MCH: 27.9 pg (ref 26.6–33.0)
MCHC: 33 g/dL (ref 31.5–35.7)
MCV: 85 fL (ref 79–97)
Monocytes Absolute: 0.5 10*3/uL (ref 0.1–0.9)
Monocytes: 8 %
Neutrophils Absolute: 4.3 10*3/uL (ref 1.4–7.0)
Neutrophils: 59 %
Platelets: 255 10*3/uL (ref 150–450)
RBC: 5.16 x10E6/uL (ref 4.14–5.80)
RDW: 12.7 % (ref 11.6–15.4)
WBC: 7.2 10*3/uL (ref 3.4–10.8)

## 2022-01-23 LAB — COMPREHENSIVE METABOLIC PANEL
ALT: 14 IU/L (ref 0–44)
AST: 23 IU/L (ref 0–40)
Albumin/Globulin Ratio: 1.5 (ref 1.2–2.2)
Albumin: 4.5 g/dL (ref 4.0–5.0)
Alkaline Phosphatase: 108 IU/L (ref 44–121)
BUN/Creatinine Ratio: 11 (ref 9–20)
BUN: 10 mg/dL (ref 6–20)
Bilirubin Total: 0.9 mg/dL (ref 0.0–1.2)
CO2: 22 mmol/L (ref 20–29)
Calcium: 9 mg/dL (ref 8.7–10.2)
Chloride: 99 mmol/L (ref 96–106)
Creatinine, Ser: 0.93 mg/dL (ref 0.76–1.27)
Globulin, Total: 3.1 g/dL (ref 1.5–4.5)
Glucose: 124 mg/dL — ABNORMAL HIGH (ref 70–99)
Potassium: 4.2 mmol/L (ref 3.5–5.2)
Sodium: 139 mmol/L (ref 134–144)
Total Protein: 7.6 g/dL (ref 6.0–8.5)
eGFR: 113 mL/min/{1.73_m2} (ref >59)

## 2022-01-23 LAB — TSH: TSH: 1.17 u[IU]/mL (ref 0.450–4.500)

## 2022-01-23 LAB — LDL CHOLESTEROL, DIRECT: LDL Direct: 129 mg/dL — ABNORMAL HIGH (ref 0–99)

## 2022-01-23 LAB — HEPATITIS C ANTIBODY
Hepatitis C Ab: NONREACTIVE
SIGNAL TO CUT-OFF: 0.08 (ref <1.00)

## 2022-01-23 LAB — LIPID PANEL
Chol/HDL Ratio: 6.2 ratio — ABNORMAL HIGH (ref 0.0–5.0)
Cholesterol, Total: 223 mg/dL — ABNORMAL HIGH (ref 100–199)
HDL: 36 mg/dL — ABNORMAL LOW (ref >39)
LDL Chol Calc (NIH): 129 mg/dL — ABNORMAL HIGH (ref 0–99)
Triglycerides: 324 mg/dL — ABNORMAL HIGH (ref 0–149)
VLDL Cholesterol Cal: 58 mg/dL — ABNORMAL HIGH (ref 5–40)

## 2022-01-23 LAB — MICROALBUMIN / CREATININE URINE RATIO
Creatinine, Urine: 203 mg/dL
Microalb/Creat Ratio: 15 mg/g creat (ref 0–29)
Microalbumin, Urine: 29.6 ug/mL

## 2022-01-23 LAB — HIV ANTIBODY (ROUTINE TESTING W REFLEX): HIV 1&2 Ab, 4th Generation: NONREACTIVE

## 2022-01-25 ENCOUNTER — Encounter: Payer: Self-pay | Admitting: Internal Medicine

## 2022-01-25 DIAGNOSIS — N62 Hypertrophy of breast: Secondary | ICD-10-CM | POA: Insufficient documentation

## 2022-01-25 DIAGNOSIS — Z0000 Encounter for general adult medical examination without abnormal findings: Secondary | ICD-10-CM | POA: Insufficient documentation

## 2022-01-25 NOTE — Assessment & Plan Note (Addendum)
By diagnostic mammogram in October.  He did not return for workup.  Checking testosterone and LH

## 2022-01-25 NOTE — Assessment & Plan Note (Signed)

## 2022-01-25 NOTE — Assessment & Plan Note (Signed)
I have addressed  BMI and recommended a low glycemic index diet utilizing smaller more frequent meals to increase metabolism.  I have also recommended that patient start exercising with a goal of 30 minutes of aerobic exercise a minimum of 5 days per week.  

## 2022-01-26 ENCOUNTER — Other Ambulatory Visit: Payer: Self-pay

## 2022-01-26 ENCOUNTER — Telehealth: Payer: Self-pay

## 2022-01-26 MED ORDER — DOXYCYCLINE HYCLATE 100 MG PO TABS
100.0000 mg | ORAL_TABLET | Freq: Two times a day (BID) | ORAL | 0 refills | Status: DC
Start: 2022-01-26 — End: 2022-07-09
  Filled 2022-01-27: qty 20, 10d supply, fill #0

## 2022-01-26 NOTE — Telephone Encounter (Signed)
Sent as a telephone encounter to Dr. Darrick Huntsman.

## 2022-01-26 NOTE — Addendum Note (Signed)
Addended by: Sherlene Shams on: 01/26/2022 12:03 PM   Modules accepted: Orders

## 2022-01-26 NOTE — Telephone Encounter (Signed)
Reply #2 .  Since he is allergic to PCN,s  I don't want to take a chance on him having a reaction to keflex since they are "cousins"  have him start the doxycycline I sent him out with last week.Marland Kitchen  a new rx for same has been sent to [facility]

## 2022-01-26 NOTE — Telephone Encounter (Signed)
Bronwen Betters  P [facility] (supporting Sherlene Shams, MD) 12 hours ago (8:22 PM)    This rash I have, my wife says it is getting worse but I'm not sure. It's very itchy and I guess it is spreading according to my wife. I have attached a couple pictures. Is their anything I can do hydrocortisone cream helps with itchiness as does the Benadryl spray.   Attachments  [id number]  [id number]  [id number]  image.jpg

## 2022-01-27 ENCOUNTER — Other Ambulatory Visit: Payer: Self-pay

## 2022-01-27 ENCOUNTER — Telehealth: Payer: Self-pay

## 2022-01-27 NOTE — Telephone Encounter (Signed)
PA for Mounjaro has been submitted on covermymeds.  

## 2022-01-28 ENCOUNTER — Other Ambulatory Visit: Payer: Self-pay

## 2022-01-28 ENCOUNTER — Encounter: Payer: Self-pay | Admitting: Pharmacist

## 2022-01-28 MED ORDER — METFORMIN HCL 850 MG PO TABS
850.0000 mg | ORAL_TABLET | Freq: Two times a day (BID) | ORAL | 1 refills | Status: DC
  Filled 2022-01-28 – 2022-01-29 (×2): qty 60, 30d supply, fill #0
  Filled 2022-02-21: qty 60, 30d supply, fill #1
  Filled 2022-03-27: qty 60, 30d supply, fill #2
  Filled 2022-04-21: qty 60, 30d supply, fill #3
  Filled 2022-05-21: qty 60, 30d supply, fill #4
  Filled 2022-06-16: qty 60, 30d supply, fill #5

## 2022-01-29 ENCOUNTER — Other Ambulatory Visit: Payer: Self-pay

## 2022-02-03 MED ORDER — PREDNISONE 10 MG PO TABS
ORAL_TABLET | ORAL | 0 refills | Status: DC
  Filled 2022-02-03: qty 33, 8d supply, fill #0

## 2022-02-04 ENCOUNTER — Other Ambulatory Visit: Payer: Self-pay

## 2022-02-04 NOTE — Telephone Encounter (Signed)
Pt has read mychart message.

## 2022-02-16 ENCOUNTER — Other Ambulatory Visit: Payer: Self-pay

## 2022-02-21 MED FILL — Pantoprazole Sodium EC Tab 40 MG (Base Equiv): ORAL | 30 days supply | Qty: 30 | Fill #2 | Status: AC

## 2022-02-21 MED FILL — Metoprolol Succinate Tab ER 24HR 25 MG (Tartrate Equiv): ORAL | 30 days supply | Qty: 30 | Fill #1 | Status: AC

## 2022-02-22 ENCOUNTER — Other Ambulatory Visit: Payer: Self-pay

## 2022-02-23 ENCOUNTER — Other Ambulatory Visit: Payer: Self-pay | Admitting: Internal Medicine

## 2022-02-23 ENCOUNTER — Other Ambulatory Visit: Payer: Self-pay

## 2022-02-23 ENCOUNTER — Encounter: Payer: Self-pay | Admitting: Internal Medicine

## 2022-02-23 NOTE — Telephone Encounter (Signed)
Not in current medication list. Last refilled a year ago.  Last OV: 01/22/2022 Next OV: 04/30/2022

## 2022-02-23 NOTE — Telephone Encounter (Signed)
Not in pt's current medication list. Is it okay to refill?    Shanda Bumps, CMA

## 2022-02-24 ENCOUNTER — Other Ambulatory Visit: Payer: Self-pay

## 2022-02-24 MED ORDER — FLUOCINONIDE 0.05 % EX CREA
TOPICAL_CREAM | CUTANEOUS | 2 refills | Status: AC
  Filled 2022-02-24: qty 30, 15d supply, fill #0
  Filled 2022-03-27: qty 30, 15d supply, fill #1
  Filled 2022-04-21: qty 30, 15d supply, fill #2
  Filled 2022-05-21: qty 60, 30d supply, fill #3

## 2022-03-16 ENCOUNTER — Other Ambulatory Visit: Payer: 59

## 2022-03-27 ENCOUNTER — Other Ambulatory Visit: Payer: Self-pay

## 2022-03-27 MED FILL — Metoprolol Succinate Tab ER 24HR 25 MG (Tartrate Equiv): ORAL | 30 days supply | Qty: 30 | Fill #2 | Status: AC

## 2022-03-27 MED FILL — Pantoprazole Sodium EC Tab 40 MG (Base Equiv): ORAL | 30 days supply | Qty: 30 | Fill #3 | Status: AC

## 2022-04-21 ENCOUNTER — Other Ambulatory Visit: Payer: Self-pay

## 2022-04-21 MED FILL — Pantoprazole Sodium EC Tab 40 MG (Base Equiv): ORAL | 30 days supply | Qty: 30 | Fill #4 | Status: AC

## 2022-04-21 MED FILL — Metoprolol Succinate Tab ER 24HR 25 MG (Tartrate Equiv): ORAL | 30 days supply | Qty: 30 | Fill #3 | Status: AC

## 2022-04-22 ENCOUNTER — Other Ambulatory Visit: Payer: Self-pay

## 2022-04-23 ENCOUNTER — Other Ambulatory Visit: Payer: Self-pay

## 2022-04-30 ENCOUNTER — Ambulatory Visit: Payer: 59 | Admitting: Internal Medicine

## 2022-05-15 HISTORY — PX: LAPAROSCOPIC APPENDECTOMY: SUR753

## 2022-05-21 ENCOUNTER — Other Ambulatory Visit: Payer: Self-pay

## 2022-05-21 MED FILL — Metoprolol Succinate Tab ER 24HR 25 MG (Tartrate Equiv): ORAL | 30 days supply | Qty: 30 | Fill #4 | Status: AC

## 2022-05-21 MED FILL — Pantoprazole Sodium EC Tab 40 MG (Base Equiv): ORAL | 30 days supply | Qty: 30 | Fill #5 | Status: AC

## 2022-05-22 ENCOUNTER — Other Ambulatory Visit: Payer: Self-pay

## 2022-06-02 LAB — HEPATIC FUNCTION PANEL
ALT: 108 U/L — AB (ref 10–40)
AST: 216 — AB (ref 14–40)
Alkaline Phosphatase: 247 — AB (ref 25–125)
Bilirubin, Total: 4

## 2022-06-09 ENCOUNTER — Other Ambulatory Visit: Payer: Self-pay

## 2022-06-16 ENCOUNTER — Other Ambulatory Visit: Payer: Self-pay

## 2022-06-16 ENCOUNTER — Other Ambulatory Visit: Payer: Self-pay | Admitting: Internal Medicine

## 2022-06-16 MED ORDER — ESCITALOPRAM OXALATE 20 MG PO TABS
ORAL_TABLET | Freq: Every day | ORAL | 3 refills | Status: DC
  Filled 2022-06-16: qty 30, 30d supply, fill #0
  Filled 2022-07-21: qty 30, 30d supply, fill #1
  Filled 2022-08-20: qty 30, 30d supply, fill #2
  Filled 2022-09-17: qty 30, 30d supply, fill #3

## 2022-06-16 MED FILL — Metoprolol Succinate Tab ER 24HR 25 MG (Tartrate Equiv): ORAL | 30 days supply | Qty: 30 | Fill #5 | Status: AC

## 2022-07-06 ENCOUNTER — Telehealth: Payer: Self-pay

## 2022-07-06 NOTE — Telephone Encounter (Signed)
Transition Care Management Follow-up Telephone Call Date of discharge and from where: [facility] 07/04/2022 How have you been since you were released from the hospital?weak feels bad Any questions or concerns? No  Items Reviewed: Did the pt receive and understand the discharge instructions provided? Yes  Medications obtained and verified? Yes  Other? No  Any new allergies since your discharge? No  Dietary orders reviewed? Yes Do you have support at home? Yes   Home Care and Equipment/Supplies: Were home health services ordered? no If so, what is the name of the agency? N/a  Has the agency set up a time to come to the patient's home? not applicable Were any new equipment or medical supplies ordered?  No What is the name of the medical supply agency? N/a Were you able to get the supplies/equipment? not applicable Do you have any questions related to the use of the equipment or supplies? No  Functional Questionnaire: (I = Independent and D = Dependent) ADLs: I  Bathing/Dressing- I  Meal Prep- I  Eating- I  Maintaining continence- I  Transferring/Ambulation- I  Managing Meds- I  Follow up appointments reviewed:  PCP Hospital f/u appt confirmed? Yes  Scheduled to see Dr Darrick Huntsman on 07/09/2022 @ 11:00. Specialist Hospital f/u appt confirmed? No  Are transportation arrangements needed? No  If their condition worsens, is the pt aware to call PCP or go to the Emergency Dept.? Yes Was the patient provided with contact information for the PCP's office or ED? Yes Was to pt encouraged to call back with questions or concerns? Yes Karena Addison, LPN [facility] Nurse Health Advisor Direct Dial [phone]

## 2022-07-09 ENCOUNTER — Encounter: Payer: Self-pay | Admitting: Internal Medicine

## 2022-07-09 ENCOUNTER — Ambulatory Visit (INDEPENDENT_AMBULATORY_CARE_PROVIDER_SITE_OTHER): Payer: 59 | Admitting: Internal Medicine

## 2022-07-09 ENCOUNTER — Other Ambulatory Visit: Payer: Self-pay

## 2022-07-09 VITALS — BP 136/80 | HR 96 | Temp 97.8°F | Wt 382.4 lb

## 2022-07-09 DIAGNOSIS — K759 Inflammatory liver disease, unspecified: Secondary | ICD-10-CM | POA: Diagnosis not present

## 2022-07-09 DIAGNOSIS — Z8719 Personal history of other diseases of the digestive system: Secondary | ICD-10-CM

## 2022-07-09 DIAGNOSIS — Z09 Encounter for follow-up examination after completed treatment for conditions other than malignant neoplasm: Secondary | ICD-10-CM

## 2022-07-09 DIAGNOSIS — B2700 Gammaherpesviral mononucleosis without complication: Secondary | ICD-10-CM

## 2022-07-09 LAB — COMPREHENSIVE METABOLIC PANEL
ALT: 69 U/L — ABNORMAL HIGH (ref 0–53)
AST: 114 U/L — ABNORMAL HIGH (ref 0–37)
Albumin: 3.8 g/dL (ref 3.5–5.2)
Alkaline Phosphatase: 257 U/L — ABNORMAL HIGH (ref 39–117)
BUN: 12 mg/dL (ref 6–23)
CO2: 30 mEq/L (ref 19–32)
Calcium: 8.8 mg/dL (ref 8.4–10.5)
Chloride: 99 mEq/L (ref 96–112)
Creatinine, Ser: 0.93 mg/dL (ref 0.40–1.50)
GFR: 108.95 mL/min (ref >60.00)
Glucose, Bld: 89 mg/dL (ref 70–99)
Potassium: 3.8 mEq/L (ref 3.5–5.1)
Sodium: 138 mEq/L (ref 135–145)
Total Bilirubin: 2.3 mg/dL — ABNORMAL HIGH (ref 0.2–1.2)
Total Protein: 7 g/dL (ref 6.0–8.3)

## 2022-07-09 MED ORDER — AMLODIPINE BESYLATE 2.5 MG PO TABS
2.5000 mg | ORAL_TABLET | Freq: Every day | ORAL | 0 refills | Status: DC
  Filled 2022-07-09: qty 30, 30d supply, fill #0
  Filled 2022-09-17: qty 30, 30d supply, fill #1

## 2022-07-09 NOTE — Progress Notes (Signed)
Subjective:  Patient ID: Mark Palmer, male    DOB: 31-Aug-1990  Age: 31 y.o. MRN: [id number]  CC: The primary encounter diagnosis was Hepatitis. 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   HPI Mark Palmer presents for  Chief Complaint  Patient presents with   Hospitalization Follow-up   HOSPITALIZED AT [place] in early October  GB surgery oct 6 and sent  home oct 8.  Marland Kitchen   READMITTED NOV 23 TO NOV 25  WITH CHILLS, FEVERS,  MALAISE ,MILD HEADACHE/VERTIGO. NOTED TO BE JAUNDICED/icteric ,  FOUND TO HAVE ACUTE HEPATITIS  WITH HEPATOSPLENOMEGALY SECONDARY TO INFECTIOUS MONONUCLEOSIS .  ACHOLIC STOOLS HAVE RESOLVED BUT STILL FEELS THE SPLENOMEGALY .  Mono was positive.   He was advised to limit tylenol to 2000 mg daily and motrin prn but has been taking 800 mg motrin tid since discharge for chills and generalized joint pain .    Htn: taking metoprolol .  Adding amlodipine 2.5 today    Type 2 DM Last aqc was < 6.0  on mounjaro alone  has not taken it since before the appy    Outpatient Medications Prior to Visit  Medication Sig Dispense Refill   ALPRAZolam (XANAX) 0.5 MG tablet Take 1 tablet (0.5 mg total) by mouth at bedtime as needed for anxiety. 30 tablet 1   escitalopram (LEXAPRO) 20 MG tablet TAKE 1 TABLET BY MOUTH DAILY 90 tablet 3   fluocinonide cream (LIDEX) 0.05 % APPLY TO THE AFFECTED AREA(S) 2 TIMES DAILY. 60 g 2   metFORMIN (GLUCOPHAGE) 850 MG tablet Take 1 tablet (850 mg total) by mouth 2 (two) times daily with a meal. 180 tablet 1   metoprolol succinate (TOPROL-XL) 25 MG 24 hr tablet TAKE 1 TABLET BY MOUTH DAILY. 90 tablet 3   ondansetron (ZOFRAN) 4 MG tablet Take 1 tablet (4 mg total) by mouth every 8 (eight) hours as needed for nausea or vomiting. 20 tablet 0   pantoprazole (PROTONIX) 40 MG tablet TAKE 1 TABLET BY MOUTH DAILY. 90  tablet 1   doxycycline (VIBRA-TABS) 100 MG tablet Take 1 tablet (100 mg total) by mouth 2 (two) times daily. 20 tablet 0   predniSONE (DELTASONE) 10 MG tablet 6 tablets daily for 3 days, then reduce by 1 tablet daily until gone 33 tablet 0   tirzepatide (MOUNJARO) 2.5 MG/0.5ML Pen Inject 2.5 mg into the skin once a week. (Patient not taking: Reported on 07/09/2022) 2 mL 1   No facility-administered medications prior to visit.    Review of Systems;  Patient denies headache, fevers, malaise, unintentional weight loss, skin rash, eye pain, sinus congestion and sinus pain, sore throat, dysphagia,  hemoptysis , cough, dyspnea, wheezing, chest pain, palpitations, orthopnea, edema, abdominal pain, nausea, melena, diarrhea, constipation, flank pain, dysuria, hematuria, urinary  Frequency, nocturia, numbness, tingling, seizures,  Focal weakness, Loss of consciousness,  Tremor, insomnia, depression, anxiety, and suicidal ideation.      Objective:  BP 136/80   Pulse 96   Temp 97.8 F (36.6 C) (Oral)   Wt (!) 382 lb 6.4 oz (173.5 kg)   SpO2 96%   BMI 51.86 kg/m   BP Readings from Last 3 Encounters:  07/09/22 136/80  01/22/22 (!) 128/94  01/24/21 130/82    Wt Readings from Last 3 Encounters:  07/09/22 (!) 382 lb 6.4 oz (173.5 kg)  01/22/22 (!) 391 lb (  177.4 kg)  01/24/21 (!) 390 lb (176.9 kg)    General appearance: alert, cooperative and appears stated age Ears: normal TM's and external ear canals both ears Throat: lips, mucosa, and tongue normal; teeth and gums normal Neck: no adenopathy, no carotid bruit, supple, symmetrical, trachea midline and thyroid not enlarged, symmetric, no tenderness/mass/nodules Back: symmetric, no curvature. ROM normal. No CVA tenderness. Lungs: clear to auscultation bilaterally Heart: regular rate and rhythm, S1, S2 normal, no murmur, click, rub or gallop Abdomen: soft, non-tender; bowel sounds normal; no masses,  no organomegaly Pulses: 2+ and  symmetric Skin: Skin color, texture, turgor normal. No rashes or lesions Lymph nodes: Cervical, supraclavicular, and axillary nodes normal. Neuro:  awake and interactive with normal mood and affect. Higher cortical functions are normal. Speech is clear without word-finding difficulty or dysarthria. Extraocular movements are intact. Visual fields of both eyes are grossly intact. Sensation to light touch is grossly intact bilaterally of upper and lower extremities. Motor examination shows 4+/5 symmetric hand grip and upper extremity and 5/5 lower extremity strength. There is no pronation or drift. Gait is non-ataxic   Lab Results  Component Value Date   HGBA1C 6.2 (A) 01/22/2022   HGBA1C 8.9 (H) 12/24/2020   HGBA1C 6.2 12/13/2019    Lab Results  Component Value Date   CREATININE 0.93 07/09/2022   CREATININE 0.93 01/22/2022   CREATININE 0.97 12/24/2020    Lab Results  Component Value Date   WBC 7.2 01/22/2022   HGB 14.4 01/22/2022   HCT 43.6 01/22/2022   PLT 255 01/22/2022   GLUCOSE 89 07/09/2022   CHOL 223 (H) 01/22/2022   TRIG 324 (H) 01/22/2022   HDL 36 (L) 01/22/2022   LDLDIRECT 129 (H) 01/22/2022   LDLCALC 129 (H) 01/22/2022   ALT 69 (H) 07/09/2022   AST 114 (H) 07/09/2022   NA 138 07/09/2022   K 3.8 07/09/2022   CL 99 07/09/2022   CREATININE 0.93 07/09/2022   BUN 12 07/09/2022   CO2 30 07/09/2022   TSH 1.170 01/22/2022   HGBA1C 6.2 (A) 01/22/2022   MICROALBUR 1.6 01/02/2021    MM DIAG BREAST TOMO BILATERAL  Result Date: 05/23/2021 CLINICAL DATA:  Palpable lump/pain in the left periareolar region. EXAM: DIGITAL DIAGNOSTIC BILATERAL MAMMOGRAM WITH TOMOSYNTHESIS AND CAD TECHNIQUE: Bilateral digital diagnostic mammography and breast tomosynthesis was performed. The images were evaluated with computer-aided detection. COMPARISON:  None. ACR Breast Density Category a: The breast tissue is almost entirely fatty. FINDINGS: There is mild gynecomastia on the left correlating  with the patient's symptoms. No suspicious findings otherwise seen in either breast. IMPRESSION: The patient is palpating mild left gynecomastia. No other suspicious findings. RECOMMENDATION: Treatment of the patient's gynecomastia should be based on clinical and physical exam. No imaging follow-up necessary. I have discussed the findings and recommendations with the patient. If applicable, a reminder letter will be sent to the patient regarding the next appointment. BI-RADS CATEGORY  2: Benign. Electronically Signed   By: Gerome Sam III M.D.   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  Admission reviewed.  Lfts repeated today and improving.  Counselled strongly to avoid physical exertion/activity for 6 week s      Hospital discharge follow-up    Patient is stable post discharge from 2nd [facility] hospitalization and has no new issues or  questions about his discharge plans,  tttransplan       History of appendicitis    S/p lap appendectomy Oct 8 at [facility].       Hepatitis - Primary    Secondary to EBV /infectious mononucleosis.  Lfts are improving based on repeat eval today.  Use of motrin and tylenol reviewed, advised to reduce motrin use and restrict tylenol to 2000 mg daily   Lab Results  Component Value Date   ALT 69 (H) 07/09/2022   AST 114 (H) 07/09/2022   ALKPHOS 257 (H) 07/09/2022   BILITOT 2.3 (H) 07/09/2022         Relevant Orders   Comprehensive metabolic panel (Completed)    Follow-up: Return in about 3 months (around 10/08/2022) for follow up diabetes.   Crecencio Mc, MD

## 2022-07-09 NOTE — Patient Instructions (Addendum)
Reduce motrin  dose to 400 mg three times daily  increase tylenol to 1000 mg twice daily   If extra pain management is needed a  request tramadol will be granted (send me mychart message)  Adding amlodipine 2.5 mg daily for BP ,  you can stop it once your BP drops below 120/70 3 days in a row

## 2022-07-10 DIAGNOSIS — Z09 Encounter for follow-up examination after completed treatment for conditions other than malignant neoplasm: Secondary | ICD-10-CM | POA: Insufficient documentation

## 2022-07-10 DIAGNOSIS — K759 Inflammatory liver disease, unspecified: Secondary | ICD-10-CM | POA: Insufficient documentation

## 2022-07-10 DIAGNOSIS — B2700 Gammaherpesviral mononucleosis without complication: Secondary | ICD-10-CM | POA: Insufficient documentation

## 2022-07-10 DIAGNOSIS — Z8719 Personal history of other diseases of the digestive system: Secondary | ICD-10-CM | POA: Insufficient documentation

## 2022-07-10 NOTE — Assessment & Plan Note (Signed)
S/p lap appendectomy Oct 8 at [facility].

## 2022-07-10 NOTE — Assessment & Plan Note (Addendum)
Secondary to EBV /infectious mononucleosis.  Lfts are improving based on repeat eval today.  Use of motrin and tylenol reviewed, advised to reduce motrin use and restrict tylenol to 2000 mg daily   Lab Results  Component Value Date   ALT 69 (H) 07/09/2022   AST 114 (H) 07/09/2022   ALKPHOS 257 (H) 07/09/2022   BILITOT 2.3 (H) 07/09/2022

## 2022-07-10 NOTE — Assessment & Plan Note (Signed)
Patient is stable post discharge from 2nd [facility] hospitalization and has no new issues or questions about his discharge plans,  tttransplan

## 2022-07-10 NOTE — Addendum Note (Signed)
Addended by: Sherlene Shams on: 07/10/2022 01:02 PM   Modules accepted: Orders

## 2022-07-10 NOTE — Assessment & Plan Note (Signed)
Hepatosplenomegaly and hepatitis by [facility] records.  Admission reviewed.  Lfts repeated today and improving.  Counselled strongly to avoid physical exertion/activity for 6 week s

## 2022-07-12 ENCOUNTER — Encounter: Payer: Self-pay | Admitting: Internal Medicine

## 2022-07-14 ENCOUNTER — Other Ambulatory Visit: Payer: Self-pay

## 2022-07-14 ENCOUNTER — Other Ambulatory Visit: Payer: Self-pay | Admitting: Internal Medicine

## 2022-07-14 MED ORDER — PREDNISONE 10 MG PO TABS
ORAL_TABLET | ORAL | 0 refills | Status: DC
  Filled 2022-07-14: qty 21, 6d supply, fill #0

## 2022-07-14 NOTE — Telephone Encounter (Signed)
Spoke with pt and he stated that he is still having a lot of pain with swallowing. He stated that the right tonsil is very swollen. He has tried OTC throat spray and a lidocaine mouth rinse. He stated that neither is helping him and he is trying not to take the Tylenol. Is there anything else that he can try?

## 2022-07-16 NOTE — Telephone Encounter (Signed)
Pt is aware.  

## 2022-07-18 LAB — HM DIABETES EYE EXAM

## 2022-07-21 ENCOUNTER — Other Ambulatory Visit: Payer: Self-pay | Admitting: Internal Medicine

## 2022-07-21 ENCOUNTER — Other Ambulatory Visit: Payer: Self-pay

## 2022-07-21 MED ORDER — METFORMIN HCL 850 MG PO TABS
850.0000 mg | ORAL_TABLET | Freq: Two times a day (BID) | ORAL | 1 refills | Status: DC
  Filled 2022-07-21: qty 60, 30d supply, fill #0
  Filled 2022-08-20: qty 60, 30d supply, fill #1
  Filled 2022-09-17: qty 60, 30d supply, fill #2

## 2022-07-21 MED ORDER — PANTOPRAZOLE SODIUM 40 MG PO TBEC
DELAYED_RELEASE_TABLET | Freq: Every day | ORAL | 1 refills | Status: DC
  Filled 2022-07-21: qty 30, 30d supply, fill #0
  Filled 2022-08-20: qty 30, 30d supply, fill #1
  Filled 2022-09-17: qty 30, 30d supply, fill #2

## 2022-07-21 MED FILL — Metoprolol Succinate Tab ER 24HR 25 MG (Tartrate Equiv): ORAL | 30 days supply | Qty: 30 | Fill #6 | Status: AC

## 2022-08-20 ENCOUNTER — Other Ambulatory Visit: Payer: Self-pay

## 2022-08-20 MED FILL — Metoprolol Succinate Tab ER 24HR 25 MG (Tartrate Equiv): ORAL | 30 days supply | Qty: 30 | Fill #7 | Status: AC

## 2022-09-17 ENCOUNTER — Other Ambulatory Visit: Payer: Self-pay

## 2022-09-17 MED FILL — Metoprolol Succinate Tab ER 24HR 25 MG (Tartrate Equiv): ORAL | 30 days supply | Qty: 30 | Fill #8 | Status: AC

## 2022-10-09 ENCOUNTER — Ambulatory Visit: Payer: 59 | Admitting: Internal Medicine

## 2022-10-19 ENCOUNTER — Other Ambulatory Visit: Payer: Self-pay

## 2022-10-19 ENCOUNTER — Ambulatory Visit (INDEPENDENT_AMBULATORY_CARE_PROVIDER_SITE_OTHER): Payer: 59 | Admitting: Internal Medicine

## 2022-10-19 ENCOUNTER — Encounter: Payer: Self-pay | Admitting: Internal Medicine

## 2022-10-19 VITALS — BP 120/60 | HR 86 | Temp 98.7°F | Resp 15 | Ht 72.0 in | Wt 384.6 lb

## 2022-10-19 DIAGNOSIS — B2700 Gammaherpesviral mononucleosis without complication: Secondary | ICD-10-CM | POA: Diagnosis not present

## 2022-10-19 DIAGNOSIS — E669 Obesity, unspecified: Secondary | ICD-10-CM | POA: Diagnosis not present

## 2022-10-19 DIAGNOSIS — M7701 Medial epicondylitis, right elbow: Secondary | ICD-10-CM | POA: Insufficient documentation

## 2022-10-19 DIAGNOSIS — K759 Inflammatory liver disease, unspecified: Secondary | ICD-10-CM

## 2022-10-19 DIAGNOSIS — E1169 Type 2 diabetes mellitus with other specified complication: Secondary | ICD-10-CM

## 2022-10-19 DIAGNOSIS — R748 Abnormal levels of other serum enzymes: Secondary | ICD-10-CM

## 2022-10-19 DIAGNOSIS — F419 Anxiety disorder, unspecified: Secondary | ICD-10-CM

## 2022-10-19 LAB — LIPID PANEL
Cholesterol: 195 mg/dL (ref 0–200)
HDL: 37.3 mg/dL — ABNORMAL LOW (ref >39.00)
NonHDL: 157.35
Total CHOL/HDL Ratio: 5
Triglycerides: 285 mg/dL — ABNORMAL HIGH (ref 0.0–149.0)
VLDL: 57 mg/dL — ABNORMAL HIGH (ref 0.0–40.0)

## 2022-10-19 LAB — COMPREHENSIVE METABOLIC PANEL
ALT: 14 U/L (ref 0–53)
AST: 21 U/L (ref 0–37)
Albumin: 4.2 g/dL (ref 3.5–5.2)
Alkaline Phosphatase: 104 U/L (ref 39–117)
BUN: 13 mg/dL (ref 6–23)
CO2: 27 mEq/L (ref 19–32)
Calcium: 9.6 mg/dL (ref 8.4–10.5)
Chloride: 98 mEq/L (ref 96–112)
Creatinine, Ser: 0.89 mg/dL (ref 0.40–1.50)
GFR: 113.48 mL/min (ref >60.00)
Glucose, Bld: 131 mg/dL — ABNORMAL HIGH (ref 70–99)
Potassium: 4 mEq/L (ref 3.5–5.1)
Sodium: 135 mEq/L (ref 135–145)
Total Bilirubin: 1.3 mg/dL — ABNORMAL HIGH (ref 0.2–1.2)
Total Protein: 7.1 g/dL (ref 6.0–8.3)

## 2022-10-19 LAB — MICROALBUMIN / CREATININE URINE RATIO
Creatinine,U: 163.8 mg/dL
Microalb Creat Ratio: 1 mg/g (ref 0.0–30.0)
Microalb, Ur: 1.7 mg/dL (ref 0.0–1.9)

## 2022-10-19 LAB — CBC WITH DIFFERENTIAL/PLATELET
Basophils Absolute: 0 10*3/uL (ref 0.0–0.1)
Basophils Relative: 0.8 % (ref 0.0–3.0)
Eosinophils Absolute: 0.1 10*3/uL (ref 0.0–0.7)
Eosinophils Relative: 2.3 % (ref 0.0–5.0)
HCT: 42.9 % (ref 39.0–52.0)
Hemoglobin: 14.4 g/dL (ref 13.0–17.0)
Lymphocytes Relative: 33.4 % (ref 12.0–46.0)
Lymphs Abs: 2 10*3/uL (ref 0.7–4.0)
MCHC: 33.6 g/dL (ref 30.0–36.0)
MCV: 81.1 fl (ref 78.0–100.0)
Monocytes Absolute: 0.5 10*3/uL (ref 0.1–1.0)
Monocytes Relative: 7.6 % (ref 3.0–12.0)
Neutro Abs: 3.3 10*3/uL (ref 1.4–7.7)
Neutrophils Relative %: 55.9 % (ref 43.0–77.0)
Platelets: 248 10*3/uL (ref 150.0–400.0)
RBC: 5.29 Mil/uL (ref 4.22–5.81)
RDW: 13.5 % (ref 11.5–15.5)
WBC: 5.9 10*3/uL (ref 4.0–10.5)

## 2022-10-19 LAB — LDL CHOLESTEROL, DIRECT: Direct LDL: 102 mg/dL

## 2022-10-19 LAB — HEMOGLOBIN A1C: Hgb A1c MFr Bld: 6.2 % (ref 4.6–6.5)

## 2022-10-19 MED ORDER — PANTOPRAZOLE SODIUM 40 MG PO TBEC
40.0000 mg | DELAYED_RELEASE_TABLET | Freq: Every day | ORAL | 1 refills | Status: DC
  Filled 2022-10-19: qty 30, 30d supply, fill #0
  Filled 2022-11-20: qty 30, 30d supply, fill #1
  Filled 2022-12-22: qty 30, 30d supply, fill #2
  Filled 2023-02-23: qty 30, 30d supply, fill #3

## 2022-10-19 MED ORDER — METFORMIN HCL 850 MG PO TABS
850.0000 mg | ORAL_TABLET | Freq: Two times a day (BID) | ORAL | 1 refills | Status: DC
  Filled 2022-10-19: qty 60, 30d supply, fill #0
  Filled 2022-11-20: qty 60, 30d supply, fill #1
  Filled 2022-12-22: qty 60, 30d supply, fill #2

## 2022-10-19 MED ORDER — ESCITALOPRAM OXALATE 20 MG PO TABS
20.0000 mg | ORAL_TABLET | Freq: Every day | ORAL | 3 refills | Status: DC
  Filled 2022-10-19: qty 30, 30d supply, fill #0
  Filled 2022-11-20: qty 30, 30d supply, fill #1
  Filled 2022-12-22: qty 30, 30d supply, fill #2
  Filled 2023-01-21: qty 30, 30d supply, fill #3
  Filled 2023-02-23: qty 30, 30d supply, fill #4

## 2022-10-19 MED ORDER — ALPRAZOLAM 0.5 MG PO TABS
0.5000 mg | ORAL_TABLET | Freq: Every evening | ORAL | 1 refills | Status: DC | PRN
  Filled 2022-10-19: qty 30, 30d supply, fill #0

## 2022-10-19 MED ORDER — METOPROLOL SUCCINATE ER 25 MG PO TB24
25.0000 mg | ORAL_TABLET | Freq: Every day | ORAL | 3 refills | Status: DC
  Filled 2022-10-19: qty 30, 30d supply, fill #0
  Filled 2022-11-20: qty 30, 30d supply, fill #1
  Filled 2022-12-22: qty 30, 30d supply, fill #2
  Filled 2023-01-21: qty 30, 30d supply, fill #3
  Filled 2023-02-23: qty 30, 30d supply, fill #4

## 2022-10-19 MED ORDER — TIRZEPATIDE 2.5 MG/0.5ML ~~LOC~~ SOAJ
2.5000 mg | SUBCUTANEOUS | 2 refills | Status: DC
  Filled 2022-10-19: qty 2, 28d supply, fill #0
  Filled 2023-01-21: qty 2, 28d supply, fill #1
  Filled 2023-05-11: qty 2, 28d supply, fill #2

## 2022-10-19 MED ORDER — PREDNISONE 10 MG PO TABS
ORAL_TABLET | ORAL | 0 refills | Status: AC
  Filled 2022-10-19: qty 21, 6d supply, fill #0

## 2022-10-19 NOTE — Assessment & Plan Note (Signed)
A total of 25 minutes of face to face time was spent with patient more than half of which was spent in counselling about his anxiety   Faith based counselling given

## 2022-10-19 NOTE — Assessment & Plan Note (Signed)
Hepatosplenomegaly and hepatitis by [facility] records .repeat LFTs ordered

## 2022-10-19 NOTE — Progress Notes (Signed)
Subjective:  Patient ID: Mark Palmer, male    DOB: [birth year]  Age: 32 y.o. MRN: [id number]  CC: The primary encounter diagnosis was Type 2 diabetes mellitus with obesity ([place]). 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   HPI Mark Palmer presents for  Chief Complaint  Patient presents with   Medical Management of Chronic Issues   Diabetes   1) GAD:  taking lexapro,  but continues to worry about the things beyond his control (state of the union ,  [place]'s failure to support [place], the upcoming election).  Home life fine,  working  from  home for [employer] . Baby and wife doing well.    Using alprazolam prn.   2) type 2 DM, obesity and hypertension: taking metformin . Darcel Bayley was stopped due to insurance non payment last year.  Can't control appetite.  Not exercising ,  but has started  walking.  Has run out of metformin.  Taking metoprolol only because home readings have been 130/80 or less   3) right elbow pain occurred after throwing wood for an hour.    Outpatient Medications Prior to Visit  Medication Sig Dispense Refill   fluocinonide cream (LIDEX) 0.05 % APPLY TO THE AFFECTED AREA(S) 2 TIMES DAILY. 60 g 2   ALPRAZolam (XANAX) 0.5 MG tablet Take 1 tablet (0.5 mg total) by mouth at bedtime as needed for anxiety. 30 tablet 1   escitalopram (LEXAPRO) 20 MG tablet TAKE 1 TABLET BY MOUTH DAILY 90 tablet 3   metFORMIN (GLUCOPHAGE) 850 MG tablet Take 1 tablet (850 mg total) by mouth 2 (two) times daily with a meal. 180 tablet 1   metoprolol succinate (TOPROL-XL) 25 MG 24 hr tablet TAKE 1 TABLET BY MOUTH DAILY. 90 tablet 3   pantoprazole (PROTONIX) 40 MG tablet TAKE 1 TABLET BY MOUTH DAILY. 90 tablet 1   amLODipine (NORVASC) 2.5 MG tablet Take 1 tablet (2.5 mg total) by mouth daily. 90  tablet 0   ondansetron (ZOFRAN) 4 MG tablet Take 1 tablet (4 mg total) by mouth every 8 (eight) hours as needed for nausea or vomiting. 20 tablet 0   predniSONE (DELTASONE) 10 MG tablet 6 tablets on Day 1 , then reduce by 1 tablet daily until gone 21 tablet 0   tirzepatide (MOUNJARO) 2.5 MG/0.5ML Pen Inject 2.5 mg into the skin once a week. (Patient not taking: Reported on 07/09/2022) 2 mL 1   No facility-administered medications prior to visit.    Review of Systems;  Patient denies headache, fevers, malaise, unintentional weight loss, skin rash, eye pain, sinus congestion and sinus pain, sore throat, dysphagia,  hemoptysis , cough, dyspnea, wheezing, chest pain, palpitations, orthopnea, edema, abdominal pain, nausea, melena, diarrhea, constipation, flank pain, dysuria, hematuria, urinary  Frequency, nocturia, numbness, tingling, seizures,  Focal weakness, Loss of consciousness,  Tremor, insomnia, depression, anxiety, and suicidal ideation.      Objective:  BP 120/60   Pulse 86   Temp 98.7 F (37.1 C) (Oral)   Resp 15   Ht 6' (1.829 m)   Wt (!) 384 lb 9.6 oz (174.5 kg)   SpO2 97%   BMI 52.16 kg/m   BP Readings from Last 3 Encounters:  10/19/22 120/60  07/09/22 136/80  01/22/22 (!) 128/94    Wt Readings from Last 3 Encounters:  10/19/22 Marland Kitchen)  384 lb 9.6 oz (174.5 kg)  07/09/22 (!) 382 lb 6.4 oz (173.5 kg)  01/22/22 (!) 391 lb (177.4 kg)    Physical Exam Vitals reviewed.  Constitutional:      General: He is not in acute distress.    Appearance: Normal appearance. He is normal weight. He is not ill-appearing, toxic-appearing or diaphoretic.  HENT:     Head: Normocephalic.  Eyes:     General: No scleral icterus.       Right eye: No discharge.        Left eye: No discharge.     Conjunctiva/sclera: Conjunctivae normal.  Cardiovascular:     Rate and Rhythm: Normal rate and regular rhythm.     Heart sounds: Normal heart sounds.  Pulmonary:     Effort: Pulmonary effort is  normal. No respiratory distress.     Breath sounds: Normal breath sounds.  Musculoskeletal:        General: Normal range of motion.     Cervical back: Normal range of motion.  Skin:    General: Skin is warm and dry.  Neurological:     General: No focal deficit present.     Mental Status: He is alert and oriented to person, place, and time. Mental status is at baseline.  Psychiatric:        Mood and Affect: Mood normal.        Behavior: Behavior normal.        Thought Content: Thought content normal.        Judgment: Judgment normal.     Lab Results  Component Value Date   HGBA1C 6.2 10/19/2022   HGBA1C 6.2 (A) 01/22/2022   HGBA1C 8.9 (H) 12/24/2020    Lab Results  Component Value Date   CREATININE 0.89 10/19/2022   CREATININE 0.93 07/09/2022   CREATININE 0.93 01/22/2022    Lab Results  Component Value Date   WBC 5.9 10/19/2022   HGB 14.4 10/19/2022   HCT 42.9 10/19/2022   PLT 248.0 10/19/2022   GLUCOSE 131 (H) 10/19/2022   CHOL 195 10/19/2022   TRIG 285.0 (H) 10/19/2022   HDL 37.30 (L) 10/19/2022   LDLDIRECT 102.0 10/19/2022   LDLCALC 129 (H) 01/22/2022   ALT 14 10/19/2022   AST 21 10/19/2022   NA 135 10/19/2022   K 4.0 10/19/2022   CL 98 10/19/2022   CREATININE 0.89 10/19/2022   BUN 13 10/19/2022   CO2 27 10/19/2022   TSH 1.170 01/22/2022   HGBA1C 6.2 10/19/2022   MICROALBUR 1.7 10/19/2022    MM DIAG BREAST TOMO BILATERAL  Result Date: 05/23/2021 CLINICAL DATA:  Palpable lump/pain in the left periareolar region. EXAM: DIGITAL DIAGNOSTIC BILATERAL MAMMOGRAM WITH TOMOSYNTHESIS AND CAD TECHNIQUE: Bilateral digital diagnostic mammography and breast tomosynthesis was performed. The images were evaluated with computer-aided detection. COMPARISON:  None. ACR Breast Density Category a: The breast tissue is almost entirely fatty. FINDINGS: There is mild gynecomastia on the left correlating with the patient's symptoms. No suspicious findings otherwise seen in either  breast. IMPRESSION: The patient is palpating mild left gynecomastia. No other suspicious findings. RECOMMENDATION: Treatment of the patient's gynecomastia should be based on clinical and physical exam. No imaging follow-up necessary. I have discussed the findings and recommendations with the patient. If applicable, a reminder letter will be sent to the patient regarding the next appointment. BI-RADS CATEGORY  2: Benign. Electronically Signed   By: Dorise Bullion III M.D.   On: 05/23/2021 15:22   Assessment & Plan:  .  Type 2 diabetes mellitus with obesity ([place]) Assessment & Plan: Controlled based on home readings ,  But needs to resume GLP 1 agonist to help manage morbid obesity .  Icontinue  metformin 850 mg bid and resume Mounjaro.    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    Anxiety Assessment & Plan: A total of 25 minutes of face to face time was spent with patient more than half of which was spent in counselling about his anxiety   Faith based counselling given    Epicondylitis elbow, medial, right Assessment & Plan: Secondary to repetitive work  "throwing wood."  Prednisone taper given.  If no improvement  refer to orthoepdics    Morbid obesity ([place]) Assessment & Plan: .I have addressed  BMI and recommended a low glycemic index diet utilizing smaller more frequent meals and Mounjaro to suppress appetite.   I have also recommended that patient start  exercising with a goal of 30 minutes of aerobic exercise a minimum of 5 days per week.    Other orders -     ALPRAZolam; Take 1 tablet (0.5 mg total) by mouth at bedtime as needed for anxiety.  Dispense: 30 tablet; Refill: 1 -     Escitalopram Oxalate; Take 1 tablet (20 mg total) by mouth daily.  Dispense: 90 tablet; Refill: 3 -     metFORMIN HCl; Take 1 tablet (850 mg total) by mouth 2 (two) times daily with a meal.  Dispense: 180 tablet; Refill: 1 -     Metoprolol Succinate ER; Take 1 tablet (25 mg total) by mouth daily.  Dispense: 90 tablet; Refill: 3 -     Pantoprazole Sodium; Take 1 tablet (40 mg total) by mouth daily.  Dispense: 90 tablet; Refill: 1 -     Tirzepatide; Inject 2.5 mg into the skin once a week.  Dispense: 2 mL; Refill: 2 -     predniSONE; Take 6 tablets (60 mg total) by mouth daily for 1 day, THEN 5 tablets (50 mg total) daily for 1 day, THEN 4 tablets (40 mg total) daily for 1 day, THEN 3 tablets (30 mg total) daily for 1 day, THEN 2 tablets (20 mg total) daily for 1 day, THEN 1 tablet (10 mg total) daily for 1 day.  Dispense: 21 tablet; Refill: 0     I provided 30 minutes of face-to-face time during this encounter reviewing patient's last visit with me,  recent surgical and non surgical procedures, previous  labs and imaging studies, counseling on  management of his anxiety ,  and post visit ordering to diagnostics and therapeutics .   Follow-up: No follow-ups on file.   Crecencio Mc, MD

## 2022-10-19 NOTE — Assessment & Plan Note (Addendum)
Secondary to repetitive work  "throwing wood."  Prednisone taper given.  If no improvement  refer to orthoepdics

## 2022-10-19 NOTE — Patient Instructions (Signed)
Get back to [place]!  BEING ANXIOUS IS NOT BIBLICALLY ADVISED!!!    "Be anxious for nothing,  But in everything , by prayer and supplication , let your request by made known to God. And the peace of God which surpasses all understanding, shall guard your hearts and minds through [employer]."    [place]

## 2022-10-19 NOTE — Assessment & Plan Note (Signed)
.  I have addressed  BMI and recommended a low glycemic index diet utilizing smaller more frequent meals and Mounjaro to suppress appetite.   I have also recommended that patient start exercising with a goal of 30 minutes of aerobic exercise a minimum of 5 days per week.

## 2022-10-19 NOTE — Assessment & Plan Note (Signed)
Occurred during episode of IM>  LFTs were elevated in late November and have not been rechecked.

## 2022-10-19 NOTE — Assessment & Plan Note (Addendum)
Controlled based on home readings ,  But needs to resume GLP 1 agonist to help manage morbid obesity .  Icontinue  metformin 850 mg bid and resume Mounjaro.    Lab Results  Component Value Date   HGBA1C 6.2 10/19/2022

## 2022-11-20 ENCOUNTER — Other Ambulatory Visit: Payer: Self-pay

## 2022-12-22 ENCOUNTER — Other Ambulatory Visit: Payer: Self-pay

## 2023-01-04 ENCOUNTER — Telehealth: Payer: Self-pay

## 2023-01-04 ENCOUNTER — Other Ambulatory Visit (HOSPITAL_COMMUNITY): Payer: Self-pay

## 2023-01-04 NOTE — Telephone Encounter (Signed)
Patient Advocate Encounter   Received notification from [employer] that prior authorization is required for [facility] 2.5MG /0.5ML pen-injectors   Submitted: 01-04-2023 Key [id number]  Status is pending

## 2023-01-18 NOTE — Telephone Encounter (Signed)
Patient Advocate Encounter  Prior Authorization for [facility] 2.5MG /0.5ML pen-injectors has been approved through [employer].    Key: [id number]  Effective: 01-05-2023 to 01-04-2024

## 2023-01-18 NOTE — Telephone Encounter (Signed)
Pt is aware.  

## 2023-01-21 ENCOUNTER — Other Ambulatory Visit: Payer: Self-pay

## 2023-02-10 ENCOUNTER — Telehealth: Payer: Self-pay

## 2023-02-10 NOTE — Telephone Encounter (Signed)
Pt called and requested copy of immunization record. Printed and emailed to pt per his request.

## 2023-02-23 ENCOUNTER — Other Ambulatory Visit: Payer: Self-pay

## 2023-03-02 ENCOUNTER — Encounter: Payer: Self-pay | Admitting: Internal Medicine

## 2023-03-02 MED ORDER — ESCITALOPRAM OXALATE 20 MG PO TABS: 20.0000 mg | ORAL_TABLET | Freq: Every day | ORAL | 2 refills | Status: DC

## 2023-03-02 MED ORDER — METOPROLOL SUCCINATE ER 25 MG PO TB24: 25.0000 mg | ORAL_TABLET | Freq: Every day | ORAL | 2 refills | Status: DC

## 2023-03-02 MED ORDER — PANTOPRAZOLE SODIUM 40 MG PO TBEC: 40.0000 mg | DELAYED_RELEASE_TABLET | Freq: Every day | ORAL | 2 refills | Status: AC

## 2023-03-02 MED ORDER — METFORMIN HCL 850 MG PO TABS: 850.0000 mg | ORAL_TABLET | Freq: Two times a day (BID) | ORAL | 2 refills | Status: AC

## 2023-05-11 ENCOUNTER — Encounter: Payer: Self-pay | Admitting: Internal Medicine

## 2023-05-11 ENCOUNTER — Other Ambulatory Visit: Payer: Self-pay

## 2023-05-11 ENCOUNTER — Other Ambulatory Visit: Payer: Self-pay | Admitting: Internal Medicine

## 2023-05-11 NOTE — Telephone Encounter (Signed)
LOV: 10/19/2022   NOV: Not scheduled

## 2023-05-12 MED ORDER — TIRZEPATIDE 2.5 MG/0.5ML ~~LOC~~ SOAJ: 2.5000 mg | SUBCUTANEOUS | 0 refills | Status: DC

## 2023-05-12 NOTE — Telephone Encounter (Signed)
Refilled: 10/19/2022 Last OV: 10/19/2022 Next OV: not scheduled

## 2023-05-13 ENCOUNTER — Other Ambulatory Visit: Payer: Self-pay

## 2023-05-14 NOTE — Telephone Encounter (Signed)
Left message to call the office and schedule a virtual visit.

## 2023-05-17 ENCOUNTER — Telehealth (INDEPENDENT_AMBULATORY_CARE_PROVIDER_SITE_OTHER): Payer: 59 | Admitting: Internal Medicine

## 2023-05-17 ENCOUNTER — Encounter: Payer: Self-pay | Admitting: Internal Medicine

## 2023-05-17 VITALS — BP 118/87 | HR 74 | Temp 97.2°F | Ht 72.0 in | Wt 384.0 lb

## 2023-05-17 DIAGNOSIS — E781 Pure hyperglyceridemia: Secondary | ICD-10-CM | POA: Diagnosis not present

## 2023-05-17 DIAGNOSIS — G4733 Obstructive sleep apnea (adult) (pediatric): Secondary | ICD-10-CM

## 2023-05-17 DIAGNOSIS — E1169 Type 2 diabetes mellitus with other specified complication: Secondary | ICD-10-CM | POA: Diagnosis not present

## 2023-05-17 DIAGNOSIS — E669 Obesity, unspecified: Secondary | ICD-10-CM | POA: Diagnosis not present

## 2023-05-17 DIAGNOSIS — Z7984 Long term (current) use of oral hypoglycemic drugs: Secondary | ICD-10-CM

## 2023-05-17 DIAGNOSIS — F419 Anxiety disorder, unspecified: Secondary | ICD-10-CM

## 2023-05-17 MED ORDER — ALPRAZOLAM 0.5 MG PO TABS: 0.5000 mg | ORAL_TABLET | Freq: Every evening | ORAL | 5 refills | Status: AC | PRN

## 2023-05-17 MED ORDER — TIRZEPATIDE 2.5 MG/0.5ML ~~LOC~~ SOAJ: 2.5000 mg | SUBCUTANEOUS | 0 refills | Status: AC

## 2023-05-17 NOTE — Assessment & Plan Note (Signed)
Controlled based on home readings ,  But needs to resume GLP 1 agonist to help manage morbid obesity .  continue  metformin 850 mg bid and resume Mounjaro.    Lab Results  Component Value Date   HGBA1C 6.2 10/19/2022

## 2023-05-17 NOTE — Assessment & Plan Note (Signed)
Chronic, previously  aggravated by hyperglycemia. .  The risk of pancreatitis was reviewed. Will reassess once his a1c is < 8.0.  May need tricor/crestor   Lab Results  Component Value Date   CHOL 195 10/19/2022   HDL 37.30 (L) 10/19/2022   LDLCALC 129 (H) 01/22/2022   LDLDIRECT 102.0 10/19/2022   TRIG 285.0 (H) 10/19/2022   CHOLHDL 5 10/19/2022

## 2023-05-17 NOTE — Assessment & Plan Note (Addendum)
MANAGED WITH LEXAPRO AND PRN ALPRAZOLAM for panic attacks triggered by weather events and concern for his family/daughter
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
## 2023-05-27 ENCOUNTER — Other Ambulatory Visit (HOSPITAL_COMMUNITY): Payer: Self-pay

## 2023-05-28 ENCOUNTER — Other Ambulatory Visit (HOSPITAL_COMMUNITY): Payer: Self-pay

## 2023-06-04 ENCOUNTER — Other Ambulatory Visit (HOSPITAL_COMMUNITY): Payer: Self-pay

## 2023-09-02 ENCOUNTER — Other Ambulatory Visit (HOSPITAL_COMMUNITY): Payer: Self-pay

## 2023-10-05 ENCOUNTER — Other Ambulatory Visit (HOSPITAL_COMMUNITY): Payer: Self-pay

## 2023-12-06 ENCOUNTER — Other Ambulatory Visit: Payer: Self-pay | Admitting: Internal Medicine

## 2023-12-07 ENCOUNTER — Other Ambulatory Visit (HOSPITAL_COMMUNITY): Payer: Self-pay

## 2023-12-07 ENCOUNTER — Telehealth: Payer: Self-pay

## 2023-12-07 NOTE — Telephone Encounter (Signed)
 Pharmacy Patient Advocate Encounter   Received notification from CoverMyMeds that prior authorization for Mounjaro  2.5MG /0.5ML auto-injectors is due for renewal.   Insurance verification completed.   The patient is insured through [facility].  Action: PA required and submitted KEY/EOC/Request #: [id number] CANCELLED due to: Cannot find matching patient with Name and Date of Birth provided.  Searched for insurance information in [facility], no insurance came back. Patient is no longer covered by [employer].

## 2023-12-31 ENCOUNTER — Telehealth: Payer: Self-pay

## 2023-12-31 NOTE — Telephone Encounter (Signed)
Awaiting the fax

## 2023-12-31 NOTE — Telephone Encounter (Signed)
 Copied from CRM [id number]. Topic: Clinical - Medication Prior Auth >> Dec 31, 2023  1:24 PM Peg Bouton F wrote: Reason for CRM: Maureen Sour from [employer] states that they need the last clinical note within 12 months and also states the order lacked the pressure settings for the CPAP machine. They are going to be faxing over the request and requests that we please send them the required information as well.     [phone] Phone-[phone]

## 2024-01-04 NOTE — Telephone Encounter (Signed)
Received and placed in quick sign folder.

## 2024-01-05 ENCOUNTER — Other Ambulatory Visit: Payer: Self-pay | Admitting: Internal Medicine

## 2024-01-05 NOTE — Assessment & Plan Note (Signed)
 Severe, diagnosed by sleep study. he is wearing his CPAP every night a minimum of 6 hours per night and notes improved daytime wakefulness and decreased fatigue

## 2024-03-21 ENCOUNTER — Other Ambulatory Visit: Payer: Self-pay | Admitting: Internal Medicine

## 2024-03-22 NOTE — Telephone Encounter (Signed)
 Refilled: 03/02/2023 Last OV: 05/17/2023 Next OV: not scheduled
